# Patient Record
Sex: Female | Born: 1990 | Race: White | Hispanic: No | Marital: Single | Smoking: Never smoker
Health system: Southern US, Community
[De-identification: ages and names within clinical notes are randomized; demographics above are authoritative.]

## PROBLEM LIST (undated history)

## (undated) DIAGNOSIS — F418 Other specified anxiety disorders: Secondary | ICD-10-CM

## (undated) DIAGNOSIS — N946 Dysmenorrhea, unspecified: Secondary | ICD-10-CM

## (undated) DIAGNOSIS — M3300 Juvenile dermatopolymyositis, organ involvement unspecified: Secondary | ICD-10-CM

## (undated) DIAGNOSIS — G43909 Migraine, unspecified, not intractable, without status migrainosus: Secondary | ICD-10-CM

## (undated) DIAGNOSIS — K589 Irritable bowel syndrome without diarrhea: Secondary | ICD-10-CM

## (undated) DIAGNOSIS — F329 Major depressive disorder, single episode, unspecified: Secondary | ICD-10-CM

## (undated) DIAGNOSIS — Z8742 Personal history of other diseases of the female genital tract: Secondary | ICD-10-CM

## (undated) HISTORY — DX: Irritable bowel syndrome, unspecified: K58.9

## (undated) HISTORY — DX: Major depressive disorder, single episode, unspecified: F32.9

## (undated) HISTORY — DX: Other specified anxiety disorders: F41.8

## (undated) HISTORY — DX: Personal history of other diseases of the female genital tract: Z87.42

## (undated) HISTORY — DX: Juvenile dermatomyositis, organ involvement unspecified: M33.00

## (undated) HISTORY — DX: Migraine, unspecified, not intractable, without status migrainosus: G43.909

## (undated) HISTORY — PX: KNEE SURGERY: SHX244

## (undated) HISTORY — DX: Dysmenorrhea, unspecified: N94.6

---
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
## 2012-02-25 ENCOUNTER — Telehealth: Payer: Self-pay | Admitting: Obstetrics and Gynecology

## 2012-02-25 NOTE — Telephone Encounter (Signed)
Tc to pt per telephone call. Informed pt Depo Provera was e-pres to pharm on 12/23/11 with 11 rf's. Pt voices understanding and will call pharm.

## 2012-03-11 ENCOUNTER — Other Ambulatory Visit (INDEPENDENT_AMBULATORY_CARE_PROVIDER_SITE_OTHER): Payer: BC Managed Care – PPO

## 2012-03-11 DIAGNOSIS — Z309 Encounter for contraceptive management, unspecified: Secondary | ICD-10-CM

## 2012-03-11 DIAGNOSIS — Z3009 Encounter for other general counseling and advice on contraception: Secondary | ICD-10-CM

## 2012-03-11 DIAGNOSIS — Z3042 Encounter for surveillance of injectable contraceptive: Secondary | ICD-10-CM

## 2012-03-11 MED ORDER — MEDROXYPROGESTERONE ACETATE 150 MG/ML IM SUSP
150.0000 mg | Freq: Once | INTRAMUSCULAR | Status: AC
  Administered 2012-03-11: 150 mg via INTRAMUSCULAR

## 2012-03-11 NOTE — Progress Notes (Signed)
Next Depo due 06/02/12

## 2012-05-25 HISTORY — PX: COLONOSCOPY: SHX174

## 2012-06-02 ENCOUNTER — Ambulatory Visit: Payer: BC Managed Care – PPO | Admitting: Obstetrics and Gynecology

## 2012-06-02 ENCOUNTER — Other Ambulatory Visit: Payer: BC Managed Care – PPO

## 2012-06-02 ENCOUNTER — Encounter: Payer: Self-pay | Admitting: Obstetrics and Gynecology

## 2012-06-02 VITALS — BP 100/62 | Ht 68.75 in | Wt 176.0 lb

## 2012-06-02 DIAGNOSIS — N898 Other specified noninflammatory disorders of vagina: Secondary | ICD-10-CM

## 2012-06-02 DIAGNOSIS — N946 Dysmenorrhea, unspecified: Secondary | ICD-10-CM

## 2012-06-02 DIAGNOSIS — Z3009 Encounter for other general counseling and advice on contraception: Secondary | ICD-10-CM

## 2012-06-02 MED ORDER — MEDROXYPROGESTERONE ACETATE 150 MG/ML IM SUSP
150.0000 mg | Freq: Once | INTRAMUSCULAR | Status: AC
  Administered 2012-06-02: 150 mg via INTRAMUSCULAR

## 2012-06-02 MED ORDER — MEDROXYPROGESTERONE ACETATE 150 MG/ML IM SUSP: 150.0000 mg | INTRAMUSCULAR | Status: AC

## 2012-06-02 NOTE — Progress Notes (Signed)
Subjective:    Michele Burke is a 22 y.o. female,G0, who presents for discussion of options of birth control. Known for juvenile dermatomyositis.Entered care here for management of primary dysmenorrhea. Forgetful with 2 trials of BCP. Now on Depo-Provera since 02/2011 and satisfied but is traveling abroad for 3 months and wanted to plan ahead.   The following portions of the patient's history were reviewed and updated as appropriate: allergies, current medications, past family history.  Review of Systems Pertinent items are noted in HPI.  Objective:    BP 100/62  Ht 5' 8.75" (1.746 m)  Wt 176 lb (79.833 kg)  BMI 26.18 kg/m2    Weight:  Wt Readings from Last 1 Encounters:  06/02/12 176 lb (79.833 kg) (93.89%*)   * Growth percentiles are based on CDC 2-20 Years data.          BMI: Body mass index is 26.18 kg/(m^2).  General Appearance: Alert, appropriate appearance for age. No acute distress GYN exam: deferred   Assessment:    Primary dysmenorrhea well managed with Depo-Provera in patient traveling abroad    Plan:    After 15 minutes face-to-face, decision to continue Depo-Provera. Will pick up 2 doses today and come back for scheduled injection this pm. I will provide a letter for our plan of care for the patient to bring to care provider in [place].  Silverio Lay MD

## 2012-06-09 ENCOUNTER — Encounter: Payer: Self-pay | Admitting: Obstetrics and Gynecology

## 2012-07-12 ENCOUNTER — Telehealth: Payer: Self-pay | Admitting: Obstetrics and Gynecology

## 2012-07-12 NOTE — Telephone Encounter (Signed)
Spoke with pt mom states pt on depo provera and is studying abroad in [place] states daughter complained of bleeding wants to know if normal on depo provera advised mom if pt has not had cycle for the last two yrs on depo and now having a cycle pt needs eval to assure nothing else is going on mom voiced understanding and will inform pt

## 2012-07-13 ENCOUNTER — Telehealth: Payer: Self-pay | Admitting: Obstetrics and Gynecology

## 2012-07-13 NOTE — Telephone Encounter (Signed)
Message sent to SR and HD regarding Rx approval for Estradiol   LC CMA

## 2012-07-14 NOTE — Telephone Encounter (Signed)
Pt mother aware  Darien Ramus, CMA

## 2012-07-14 NOTE — Telephone Encounter (Signed)
It is not unusual with depo-Provera to experience BTB/ BTS. I suspect this is why the doctor prescribed a short course of Estradiol. This is customary practice.

## 2012-11-29 ENCOUNTER — Other Ambulatory Visit: Payer: Self-pay | Admitting: Gastroenterology

## 2012-11-29 DIAGNOSIS — R109 Unspecified abdominal pain: Secondary | ICD-10-CM

## 2012-11-29 DIAGNOSIS — R197 Diarrhea, unspecified: Secondary | ICD-10-CM

## 2012-12-01 ENCOUNTER — Ambulatory Visit
Admission: RE | Admit: 2012-12-01 | Discharge: 2012-12-01 | Disposition: A | Discharge status: Home / Self Care | Payer: Self-pay | Source: Ambulatory Visit | Attending: Gastroenterology | Admitting: Gastroenterology

## 2012-12-01 DIAGNOSIS — R197 Diarrhea, unspecified: Secondary | ICD-10-CM

## 2012-12-01 DIAGNOSIS — R109 Unspecified abdominal pain: Secondary | ICD-10-CM

## 2012-12-01 MED ORDER — IOHEXOL 300 MG/ML  SOLN
100.0000 mL | Freq: Once | INTRAMUSCULAR | Status: AC | PRN
  Administered 2012-12-01: 100 mL via INTRAVENOUS

## 2013-05-27 ENCOUNTER — Emergency Department (HOSPITAL_COMMUNITY)
Admission: EM | Admit: 2013-05-27 | Discharge: 2013-05-28 | Disposition: A | Discharge status: Home / Self Care | Payer: No Typology Code available for payment source | Attending: Emergency Medicine | Admitting: Emergency Medicine

## 2013-05-27 ENCOUNTER — Encounter (HOSPITAL_COMMUNITY): Payer: Self-pay | Admitting: Emergency Medicine

## 2013-05-27 ENCOUNTER — Emergency Department (HOSPITAL_COMMUNITY): Payer: No Typology Code available for payment source

## 2013-05-27 DIAGNOSIS — Z8679 Personal history of other diseases of the circulatory system: Secondary | ICD-10-CM | POA: Insufficient documentation

## 2013-05-27 DIAGNOSIS — M3390 Dermatopolymyositis, unspecified, organ involvement unspecified: Secondary | ICD-10-CM | POA: Insufficient documentation

## 2013-05-27 DIAGNOSIS — M3313 Other dermatomyositis without myopathy: Secondary | ICD-10-CM | POA: Insufficient documentation

## 2013-05-27 DIAGNOSIS — Z8742 Personal history of other diseases of the female genital tract: Secondary | ICD-10-CM | POA: Insufficient documentation

## 2013-05-27 DIAGNOSIS — IMO0002 Reserved for concepts with insufficient information to code with codable children: Secondary | ICD-10-CM | POA: Insufficient documentation

## 2013-05-27 DIAGNOSIS — R091 Pleurisy: Secondary | ICD-10-CM | POA: Insufficient documentation

## 2013-05-27 DIAGNOSIS — Z79899 Other long term (current) drug therapy: Secondary | ICD-10-CM | POA: Insufficient documentation

## 2013-05-27 LAB — CK: Total CK: 76 U/L (ref 7–177)

## 2013-05-27 LAB — URINE MICROSCOPIC-ADD ON

## 2013-05-27 LAB — CBC WITH DIFFERENTIAL/PLATELET
Basophils Absolute: 0 10*3/uL (ref 0.0–0.1)
Basophils Relative: 1 % (ref 0–1)
Eosinophils Absolute: 0.1 10*3/uL (ref 0.0–0.7)
Eosinophils Relative: 2 % (ref 0–5)
HCT: 33.8 % — ABNORMAL LOW (ref 36.0–46.0)
Hemoglobin: 11.6 g/dL — ABNORMAL LOW (ref 12.0–15.0)
Lymphocytes Relative: 50 % — ABNORMAL HIGH (ref 12–46)
Lymphs Abs: 1.8 10*3/uL (ref 0.7–4.0)
MCH: 31.9 pg (ref 26.0–34.0)
MCHC: 34.3 g/dL (ref 30.0–36.0)
MCV: 92.9 fL (ref 78.0–100.0)
Monocytes Absolute: 0.4 10*3/uL (ref 0.1–1.0)
Monocytes Relative: 10 % (ref 3–12)
Neutro Abs: 1.3 10*3/uL — ABNORMAL LOW (ref 1.7–7.7)
Neutrophils Relative %: 37 % — ABNORMAL LOW (ref 43–77)
Platelets: 193 10*3/uL (ref 150–400)
RBC: 3.64 MIL/uL — ABNORMAL LOW (ref 3.87–5.11)
RDW: 13.1 % (ref 11.5–15.5)
WBC: 3.5 10*3/uL — ABNORMAL LOW (ref 4.0–10.5)

## 2013-05-27 LAB — BASIC METABOLIC PANEL
BUN: 13 mg/dL (ref 6–23)
CO2: 22 mEq/L (ref 19–32)
Calcium: 8.8 mg/dL (ref 8.4–10.5)
Chloride: 103 mEq/L (ref 96–112)
Creatinine, Ser: 0.6 mg/dL (ref 0.50–1.10)
GFR calc Af Amer: >90 mL/min (ref >90)
GFR calc non Af Amer: >90 mL/min (ref >90)
Glucose, Bld: 89 mg/dL (ref 70–99)
Potassium: 3.8 mEq/L (ref 3.7–5.3)
Sodium: 136 mEq/L — ABNORMAL LOW (ref 137–147)

## 2013-05-27 LAB — URINALYSIS, ROUTINE W REFLEX MICROSCOPIC
Bilirubin Urine: NEGATIVE
Glucose, UA: NEGATIVE mg/dL
Hgb urine dipstick: NEGATIVE
Ketones, ur: NEGATIVE mg/dL
Nitrite: NEGATIVE
Protein, ur: NEGATIVE mg/dL
Specific Gravity, Urine: 1.008 (ref 1.005–1.030)
Urobilinogen, UA: 0.2 mg/dL (ref 0.0–1.0)
pH: 7 (ref 5.0–8.0)

## 2013-05-27 LAB — D-DIMER, QUANTITATIVE: D-Dimer, Quant: <0.27 ug/mL-FEU (ref 0.00–0.48)

## 2013-05-27 LAB — TROPONIN I

## 2013-05-27 LAB — PREGNANCY, URINE: Preg Test, Ur: NEGATIVE

## 2013-05-27 MED ORDER — ONDANSETRON HCL 4 MG/2ML IJ SOLN
4.0000 mg | Freq: Once | INTRAMUSCULAR | Status: AC
  Administered 2013-05-27: 4 mg via INTRAVENOUS
  Filled 2013-05-27: qty 2

## 2013-05-27 MED ORDER — KETOROLAC TROMETHAMINE 30 MG/ML IJ SOLN
30.0000 mg | Freq: Once | INTRAMUSCULAR | Status: AC
  Administered 2013-05-27: 30 mg via INTRAVENOUS
  Filled 2013-05-27: qty 1

## 2013-05-27 MED ORDER — HYDROCODONE-ACETAMINOPHEN 5-325 MG PO TABS: 2.0000 | ORAL_TABLET | ORAL | Status: DC | PRN

## 2013-05-27 MED ORDER — NAPROXEN 500 MG PO TABS: 500.0000 mg | ORAL_TABLET | Freq: Two times a day (BID) | ORAL | Status: DC

## 2013-05-27 MED ORDER — MORPHINE SULFATE 4 MG/ML IJ SOLN
6.0000 mg | Freq: Once | INTRAMUSCULAR | Status: AC
  Administered 2013-05-27: 6 mg via INTRAVENOUS
  Filled 2013-05-27: qty 2

## 2013-05-27 NOTE — ED Notes (Addendum)
Patient is from home accompanied by her mom. Patient states she has been having chest pressure for one week but it has gotten worst today have immunoglobulin infusion today. Patient states the pain got worst when she climbed the steps this evening.  Patient denies sob or NV/ with chest pain. 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

## 2013-05-27 NOTE — Discharge Instructions (Signed)
Pleurisy °Pleurisy is redness, puffiness (swelling), and soreness (inflammation) of the lining of the lungs. It can be hard to breathe and hurt to breathe. Coughing or deep breathing will make it hurt more. It is often caused by an existing infection or disease.  °HOME CARE °· Only take medicine as told by your doctor. °· Only take antibiotic medicine as directed. Make sure to finish it even if you start to feel better. °GET HELP RIGHT AWAY IF:  °· Your lips, fingernails, or toenails are blue or dark. °· You cough up blood. °· You have a hard time breathing. °· Your pain is not controlled with medicine or it lasts for more than 1 week. °· Your pain spreads (radiates) into your neck, arms, or jaw. °· You are short of breath or wheezing. °· You develop a fever, rash, throw up (vomit), or faint. °MAKE SURE YOU:  °· Understand these instructions. °· Will watch your condition. °· Will get help right away if you are not doing well or get worse. °Document Released: 04/23/2008 Document Revised: 01/11/2013 Document Reviewed: 10/23/2012 °Exit[employer]® Patient Information ©2014 [employer]. ° °

## 2013-05-27 NOTE — ED Provider Notes (Signed)
Patient seen with Ebbie Ridgehris Lawyer PA-C. Meds reviewed. Pt history reviewed and pt ec=xamined. Slight risk of DVT/Pe with IGG.  D-Dimer negative.  No EKG changes, and normal Troponin.  Normal Cxr.  No fever, clear lungs.  Have ruled our ACS, PE, Pneumonia, PTX.  Plan is tx for pleurisy with NSAID and pain meds.  Info given.  Discussed with pt and parents.  Rolland PorterMark Turner Kunzman, MD 05/27/13 2317

## 2013-05-27 NOTE — ED Notes (Signed)
Assumed care of patient Patient states that she began experiencing CP and mild SOB after her transfusion today Patient states that she has had similar episodes before, but "it's never happened this soon afterwards." Patient describes pain in the center of her chest as "pressure, like someone is sitting on me" Patient states that pain is "better now that I'm laying down." Will inform MD/PA of patient c/o pain LCTA

## 2013-05-27 NOTE — ED Notes (Signed)
Patient ambulated to CXR without difficulty

## 2013-05-27 NOTE — ED Provider Notes (Signed)
CSN: [id number]     Arrival date & time 05/27/13  1849 History   First MD Initiated Contact with Patient 05/27/13 1923     Chief Complaint  Patient presents with  . Chest Pain   (Consider location/radiation/quality/duration/timing/severity/associated sxs/prior Treatment) HPI Patient presents emergency department with chest pressure for the past week, but has gotten worse over the last 12 hours.  Patient, states she got an immunoglobulin infusion, today, and the pain got worse patient denies nausea, vomiting, shortness breath, weakness, dizziness, headache, blurred vision, back pain, dysuria, fever, cough, sore throat, abdominal pain, or syncope.  The patient, states she did not take any medications prior to arrival.  She denies any thing that makes her pain, better or worse. Past Medical History  Diagnosis Date  . Migraines 07/11/2010  . History of menorrhagia 07/11/2010  . Dysmenorrhea 07/11/2010  . Juvenile dermatomyositis 07/11/2010   Past Surgical History  Procedure Laterality Date  . Wisdom tooth extraction  2011  . Tonsillectomy  2000  . Knee surgery     Family History  Problem Relation Age of Onset  . Hypertension Father    History  Substance Use Topics  . Smoking status: Never Smoker   . Smokeless tobacco: Never Used  . Alcohol Use: No   OB History   Grav Para Term Preterm Abortions TAB SAB Ect Mult Living   0              Review of Systems All other systems negative except as documented in the HPI. All pertinent positives and negatives as reviewed in the HPI. Allergies  Other  Home Medications   Current Outpatient Rx  Name  Route  Sig  Dispense  Refill  . calcium carbonate (OS-CAL) 600 MG TABS   Oral   Take 600 mg by mouth 2 (two) times daily with a meal.         . dexmethylphenidate (FOCALIN XR) 15 MG 24 hr capsule   Oral   Take 15 mg by mouth daily as needed (to help with focus issues).          . hydroxychloroquine (PLAQUENIL) 200 MG tablet   Oral  Take 400 mg by mouth daily.          . Immune Globulin  10% (PRIVIGEN) 20 GM/200ML SOLN   Intravenous   Inject 60 g into the vein every 21 ( twenty-one) days. 60 grams over 8 hours         . medroxyPROGESTERone (DEPO-PROVERA) 150 MG/ML injection   Intramuscular   Inject 1 mL (150 mg total) into the muscle every 3 (three) months.   2 mL   11     Please serve 2 doses Patient is traveling abroad t ...   . methotrexate (RHEUMATREX) 2.5 MG tablet   Oral   Take 20 mg by mouth once a week. monday         . mycophenolate (CELLCEPT) 500 MG tablet   Oral   Take 500 mg by mouth 2 (two) times daily.         . ondansetron (ZOFRAN) 4 MG tablet   Oral   Take 4-8 mg by mouth every 8 (eight) hours as needed for nausea or vomiting.         . predniSONE (DELTASONE) 10 MG tablet   Oral   Take 10 mg by mouth daily with breakfast.         . sertraline (ZOLOFT) 50 MG tablet   Oral  Take 50 mg by mouth daily.          BP 135/79  Pulse 82  Temp(Src) 98.5 F (36.9 C) (Oral)  SpO2 98% Physical Exam  Nursing note and vitals reviewed. Constitutional: She is oriented to person, place, and time. She appears well-developed and well-nourished. No distress.  HENT:  Head: Normocephalic and atraumatic.  Mouth/Throat: Oropharynx is clear and moist.  Eyes: Pupils are equal, round, and reactive to light.  Neck: Normal range of motion. Neck supple.  Cardiovascular: Normal rate, regular rhythm and normal heart sounds.  Exam reveals no gallop and no friction rub.   No murmur heard. Pulmonary/Chest: Effort normal and breath sounds normal. No respiratory distress. She has no wheezes.  Neurological: She is alert and oriented to person, place, and time.  Skin: Skin is warm and dry. No rash noted. No erythema.    ED Course  Procedures (including critical care time) Labs Review Labs Reviewed  CBC WITH DIFFERENTIAL - Abnormal; Notable for the following:    WBC 3.5 (*)    RBC 3.64 (*)     Hemoglobin 11.6 (*)    HCT 33.8 (*)    Neutrophils Relative % 37 (*)    Neutro Abs 1.3 (*)    Lymphocytes Relative 50 (*)    All other components within normal limits  BASIC METABOLIC PANEL - Abnormal; Notable for the following:    Sodium 136 (*)    All other components within normal limits  PREGNANCY, URINE  URINALYSIS, ROUTINE W REFLEX MICROSCOPIC   Imaging Review Dg Chest 2 View  05/27/2013   CLINICAL DATA:  Chest pressure for 1 week, worse today. Autoimmune disorder with immunoglobulin infusion today.  EXAM: CHEST  2 VIEW  COMPARISON:  None.  FINDINGS: The heart size and mediastinal contours are normal. The lungs are clear. There is no pleural effusion or pneumothorax. No acute osseous findings are identified.  IMPRESSION: No active cardiopulmonary process.   Electronically Signed   By: Roxy HorsemanBill  Veazey M.D.   On: 05/27/2013 20:15    EKG Interpretation    Date/Time:  Saturday May 27 2013 19:03:00 EST Ventricular Rate:  72 PR Interval:  125 QRS Duration: 83 QT Interval:  372 QTC Calculation: 407 R Axis:   44 Text Interpretation:  Sinus arrhythmia Confirmed by WARD  DO, KRISTEN ([id number]) on 05/27/2013 7:28:45 PM            Patient will be treated for pleuritic type chest pain, must follow up with her primary care Dr. told to return here as needed.  Patient was given the results.  All questions were answered  Carlyle DollyChristopher W Mel Tadros, PA-C 05/27/13 2330

## 2014-09-23 HISTORY — PX: ULNA OSTEOTOMY: SHX1077

## 2015-02-28 ENCOUNTER — Ambulatory Visit: Payer: Self-pay | Admitting: Family Medicine

## 2015-02-28 ENCOUNTER — Encounter: Payer: Self-pay | Admitting: Family Medicine

## 2015-02-28 ENCOUNTER — Ambulatory Visit (INDEPENDENT_AMBULATORY_CARE_PROVIDER_SITE_OTHER): Payer: BLUE CROSS/BLUE SHIELD | Admitting: Family Medicine

## 2015-02-28 VITALS — BP 102/62 | HR 72 | Ht 69.0 in | Wt 159.4 lb

## 2015-02-28 DIAGNOSIS — Z23 Encounter for immunization: Secondary | ICD-10-CM | POA: Diagnosis not present

## 2015-02-28 DIAGNOSIS — R1013 Epigastric pain: Secondary | ICD-10-CM

## 2015-02-28 DIAGNOSIS — M3300 Juvenile dermatopolymyositis, organ involvement unspecified: Secondary | ICD-10-CM | POA: Diagnosis not present

## 2015-02-28 DIAGNOSIS — F325 Major depressive disorder, single episode, in full remission: Secondary | ICD-10-CM

## 2015-02-28 MED ORDER — DEXLANSOPRAZOLE 60 MG PO CPDR: 60.0000 mg | DELAYED_RELEASE_CAPSULE | Freq: Every day | ORAL | Status: DC

## 2015-02-28 NOTE — Patient Instructions (Signed)
We are giving you Prevnar (pneumonia vaccine), flu shot and Gardisil (HPV vaccine).   You likely have gastritis vs a nonbleeding ulcer. Using a PPI such as Nexium, Prilosec, etc should help.  Likely the GI doctor will give you a prescription tomorrow. Avoid all anti-inflammatory medication which will cause more pain/irritation. You may also have a component of reflux (belching).  Food Choices for Gastroesophageal Reflux Disease, Adult When you have gastroesophageal reflux disease (GERD), the foods you eat and your eating habits are very important. Choosing the right foods can help ease the discomfort of GERD. WHAT GENERAL GUIDELINES DO I NEED TO FOLLOW?  Choose fruits, vegetables, whole grains, low-fat dairy products, and low-fat meat, fish, and poultry.  Limit fats such as oils, salad dressings, butter, nuts, and avocado.  Keep a food diary to identify foods that cause symptoms.  Avoid foods that cause reflux. These may be different for different people.  Eat frequent small meals instead of three large meals each day.  Eat your meals slowly, in a relaxed setting.  Limit fried foods.  Cook foods using methods other than frying.  Avoid drinking alcohol.  Avoid drinking large amounts of liquids with your meals.  Avoid bending over or lying down until 2-3 hours after eating. WHAT FOODS ARE NOT RECOMMENDED? The following are some foods and drinks that may worsen your symptoms: Vegetables Tomatoes. Tomato juice. Tomato and spaghetti sauce. Chili peppers. Onion and garlic. Horseradish. Fruits Oranges, grapefruit, and lemon (fruit and juice). Meats High-fat meats, fish, and poultry. This includes hot dogs, ribs, ham, sausage, salami, and bacon. Dairy Whole milk and chocolate milk. Sour cream. Cream. Butter. Ice cream. Cream cheese.  Beverages Coffee and tea, with or without caffeine. Carbonated beverages or energy drinks. Condiments Hot sauce. Barbecue sauce.   Sweets/Desserts Chocolate and cocoa. Donuts. Peppermint and spearmint. Fats and Oils High-fat foods, including [place] fries and potato chips. Other Vinegar. Strong spices, such as black pepper, white pepper, red pepper, cayenne, curry powder, cloves, ginger, and chili powder. The items listed above may not be a complete list of foods and beverages to avoid. Contact your dietitian for more information.   This information is not intended to replace advice given to you by your health care provider. Make sure you discuss any questions you have with your health care provider.   Document Released: 05/11/2005 Document Revised: 06/01/2014 Document Reviewed: 03/15/2013 Elsevier Interactive Patient Education [employer]

## 2015-02-28 NOTE — Progress Notes (Signed)
Chief Complaint  Patient presents with  . Establish Care    new patient to establish. Seeing specialist tomorrow w/[facility] for nausea and stomach pains-PA for Dr.Schooler.   . Flu Vaccine    would like to have flu shot but wanted to know if she should have HD Flu because of her auto-immune disease? Also wants pneumonia vaccine.    Patient presents to establish care.  She currently has been having stomach pain, and has appointment with GI tomorrow.   She reports that she went to [facility] 9/15-17.  Friend had a stomach bug, and then she started having nausea, felt bad.  Felt worse last week and into this week.  Ongoing nausea, and abdominal pain (upper), gas and bloating.  Passing foul-smelling gas, frequent hiccups and belching more than usual. Feels like she needs to eat more, pain with empty stomach.  Hasn't actually vomited. Bowels are normal.  They were loose about 10 days ago, but that resolved. Denies hematochezia, melena, coffee-ground emesis or any emesis for that matter.  She started taking Zantac 3 days ago, taking it twice daily ( )--it has helped a little.  Zofran wasn't helpful with the nausea.  She has been taking advil regularly since 05/2014 for left wrist pain. She had surgery in May, still having pain.  She was taking 600-800mg  up to two or three times daily.  She stopped the advil earlier this week, and has been taking tylenol instead.  Immunizations:  Reports having had 2 of the 3 Gardisil, last of which was over a year ago.  She was told she needed to start the series over again. She has never been sexually active. She is also asking about pneumonia vaccine, due to her immunosuppressive medications. No immunization records are available.  She reports that her last pneumovax was about 9-10 years ago. She has never had prevnar.  Other doctors caring for patient: Dr. Denyce Robert at [facility] Rheumatology Psych:  Dr. Phillip Heal ([facility]) GYN: Dr. Estanislado Pandy  (getting regular Depo Provera shots) GI: [facility] (Dr. Bosie Clos) Has appt tomorrow morning with his PA Dentist: Dr. Yancey Flemings Ophtho:  [facility], Dr. Randon Goldsmith  Past Medical History  Diagnosis Date  . Migraines middle school  . History of menorrhagia 07/11/2010  . Dysmenorrhea 07/11/2010  . 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 Irritable bowel syndrome   . Depression 2008    recurred in HS  . 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 Wisdom tooth extraction  2011  . Tonsillectomy  2000  . Knee surgery Right     plica removal, arthroscopic  . Ulna osteotomy Left 09/2014    Dr. Orlan Leavens; ulna shortening  . Colonoscopy  2014    Dr. Jerral Ralph    Social History   Social History  . Marital Status: Single    Spouse Name: N/A  . Number of Children: N/A  . Years of Education: N/A   Occupational History  . student    Social History Main Topics  . Smoking status: Never Smoker   . Smokeless tobacco: Never Used  . Alcohol Use: No     Comment: infrequent  . Drug Use: No  . Sexual Activity: No     Comment: CELIBATE   Other Topics Concern  . Not on file   Social History Narrative   She is a Holiday representative at [employer] (took a year off;  studied in [place] x 3 mos, lived with family in [place] x 3 mos).  Started with GI problems upon return from [place].    Family History  Problem Relation Age of Onset  . Hypertension Father   . Hypothyroidism Mother   . Hyperlipidemia Mother     borderline; good HDL, borderline LDL  . Allergies Mother   . Lupus Maternal Grandmother   . Heart disease Maternal Grandfather     CHF  . COPD Maternal Grandfather   . Asthma Maternal Grandfather   . Cancer Maternal Grandfather     thymic  . Stroke Paternal Grandfather 37  . Diabetes Paternal Grandfather     Outpatient Encounter Prescriptions as of 02/28/2015  Medication Sig Note  . calcium carbonate (OS-CAL) 600 MG TABS  Take 600 mg by mouth 2 (two) times daily with a meal. 02/28/2015: Takes once daily  . cetirizine (ZYRTEC) 10 MG tablet Take 10 mg by mouth daily.   . cholecalciferol (VITAMIN D) 1000 UNITS tablet Take 1,000 Units by mouth daily.   . hydroxychloroquine (PLAQUENIL) 200 MG tablet Take 400 mg by mouth daily.    Marland Kitchen immune globulin, human, (GAMMAGARD S/D) 5 G injection Inject into the vein. 02/28/2015: Two days in a row, once a month, IV infusion  . leucovorin (WELLCOVORIN) 5 MG tablet Take 10 mg by mouth 2 (two) times a week. 02/28/2015: Takes the day after methotrexate   . medroxyPROGESTERone (DEPO-PROVERA) 150 MG/ML injection Inject 1 mL (150 mg total) into the muscle every 3 (three) months. 02/28/2015: Every 3 mos (at Dr. Cloretta Ned office), next due in 2 weeks  . methotrexate (RHEUMATREX) 2.5 MG tablet Take 10 mg by mouth once a week. monday   . Multiple Vitamins-Minerals (HAIR/SKIN/NAILS PO) Take 1 tablet by mouth daily.   . sertraline (ZOLOFT) 100 MG tablet Take 150 mg by mouth daily.   Marland Kitchen dexmethylphenidate (FOCALIN XR) 15 MG 24 hr capsule Take 15 mg by mouth daily as needed (to help with focus issues).  02/28/2015: Uses prn when taking exams (to help with distractibility)  . [DISCONTINUED] HYDROcodone-acetaminophen (NORCO/VICODIN) 5-325 MG per tablet Take 2 tablets by mouth every 4 (four) hours as needed.   . [DISCONTINUED] Immune Globulin  10% (PRIVIGEN) 20 GM/200ML SOLN Inject 60 g into the vein every 21 ( twenty-one) days. 60 grams over 8 hours   . [DISCONTINUED] mycophenolate (CELLCEPT) 500 MG tablet Take 500 mg by mouth 2 (two) times daily.   . [DISCONTINUED] naproxen (NAPROSYN) 500 MG tablet Take 1 tablet (500 mg total) by mouth 2 (two) times daily.   . [DISCONTINUED] ondansetron (ZOFRAN) 4 MG tablet Take 4-8 mg by mouth every 8 (eight) hours as needed for nausea or vomiting.   . [DISCONTINUED] predniSONE (DELTASONE) 10 MG tablet Take 10 mg by mouth daily with breakfast.   . [DISCONTINUED]  sertraline (ZOLOFT) 50 MG tablet Take 50 mg by mouth daily.    No facility-administered encounter medications on file as of 02/28/2015.    Allergies  Allergen Reactions  . Other     seasonal   ROS: no fever, chills, URI symptoms, cough, shortness of breath, headaches, dizziness, chest pain. +GI complaints as per HPI. No urinary complaints. No bleeding, bruising, rash. Depression is controlled, no anxiety.  PHYSICAL EXAM: BP 102/62 mmHg  Pulse 72  Ht 5\' 9"  (1.753 m)  Wt 159 lb 6.4 oz (72.303 kg)  BMI 23.53 kg/m2  Well developed, pleasant female, accompanied by her mother, in no distress HEENT: PERRL,  EOMI, conjunctiva clear. OP is clear Neck: no lymphadenopathy thyromegaly or bruit Heart: regular rate and rhythm without murmur Lungs: clear bilaterally, no wheezes, rales, ronchi Back: no CVA or spinal tenderness Abdomen: +epigastric tenderness. No rebound tenderness, guarding, or mass.  Normal bowel sounds.  No hepatosplenomegaly, negative Murphy sign Extremities: WHSS left forearm/wrist. No edema, normal pulses Skin: no rashes Neuro: alert and oriented, cranial nerves intact Psych: normal mood, affect, hygiene and grooming  ASSESSMENT/PLAN:  Need for prophylactic vaccination and inoculation against influenza - Plan: Flu Vaccine QUAD 36+ mos PF IM (Fluarix & Fluzone Quad PF)  Abdominal pain, epigastric - suspect gastritis, may have component of GERD. Stop NSAIDS. PPI. fu with GI tomorrow as scheduled. - Plan: dexlansoprazole (DEXILANT) 60 MG capsule  Immunization due - Plan: HPV 9-valent vaccine,Recombinat (Gardasil 9), Pneumococcal conjugate vaccine 13-valent  Juvenile dermatomyositis (HCC) - under care of rheum, stable on current regimen. Prevnar rec given immunosuppressive meds  Depression, major, in remission (HCC) - continue care with psych, doing well   Prevnar-13, HPV #3 and flu shot today Risks/side effects reviewed. Discussed that efficacy of HPV could be less due  to not following recommended timing (late on third vaccine), but I don't usually recommend restarting the series (as she was told by her GYN).  She was told to start series over. She was advised that the vaccines are the same, so that if she discusses further with her other doctors, and it is felt she should repeat the whole series, that her next immunization would be due in 2 mos.  Stop NSAIDs. Given #5 Dexilant samples to take instead of Zantac. F/u with GI tomorrow. DDx reviewed in detail (virus, gastritis, ulcer, GERD, H pylori)  F/u prn

## 2015-03-07 ENCOUNTER — Telehealth: Payer: Self-pay | Admitting: *Deleted

## 2015-03-07 NOTE — Telephone Encounter (Signed)
Notes were reviewed.  She was seen on 10/7.  No mention of her having tried the Dexilant samples given by us in their notes--when you call patient, see if she took them.  She was prescribed pantoprazole 40mg .  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  Give it another week before getting second opinion, as I truly think she will feel better with this. Her labs were reassuring. If not improving (or if worse) next week, then second opinion would be indicated.  Technically, this GI visit was a second opinion of what I had said at her visit.  I'm happy to get another opinion, but give the meds just a little more time to work.  (and as stated, next step should also include H pylori eval, if not getting better).  (see my other note as well to advise pt)

## 2015-03-07 NOTE — Telephone Encounter (Signed)
Records in red folder on your shelf. Do you want to review before I call her?

## 2015-03-07 NOTE — Telephone Encounter (Signed)
Please try and get notes.  I also suspected gastritis (vs component of reflux) based on my assessment. We gave her Dexilant samples and told her to stop all NSAIDs.  What was their recommendation/treatment?  This shouldn't be a long-term problem, just need to get on the right medications to treat (give it a few weeks), and if not improving ,may need endoscopy.  She should likely give the treatment that was recommended enough time to work. If worsening, the other possibility is H.pylori (which would be treated differently).  Okay to refer to [facility] if worsening, but they might want her to be on the PPI's for 2 weeks before doing more invasive testing/procedures.

## 2015-03-07 NOTE — Telephone Encounter (Signed)
Went over all of the messages with patient. She did verbalize understanding and she will call back end of next week if not better or worse and we will put in referral to [facility] per Dr.Knapp.

## 2015-03-07 NOTE — Telephone Encounter (Signed)
Error

## 2015-03-07 NOTE — Telephone Encounter (Signed)
Patient was diagnosed with gast[facility] this past Tuesday at [facility], requesting to see someone at [facility] for a second opinion-just wants to start over and see another practice. Feels like her stomach symptoms are worsening.

## 2015-05-14 ENCOUNTER — Ambulatory Visit (INDEPENDENT_AMBULATORY_CARE_PROVIDER_SITE_OTHER): Payer: BLUE CROSS/BLUE SHIELD | Admitting: Family Medicine

## 2015-05-14 ENCOUNTER — Encounter: Payer: Self-pay | Admitting: Family Medicine

## 2015-05-14 VITALS — BP 122/72 | HR 60 | Temp 98.1°F | Wt 160.4 lb

## 2015-05-14 DIAGNOSIS — R112 Nausea with vomiting, unspecified: Secondary | ICD-10-CM

## 2015-05-14 DIAGNOSIS — J069 Acute upper respiratory infection, unspecified: Secondary | ICD-10-CM | POA: Diagnosis not present

## 2015-05-14 DIAGNOSIS — R059 Cough, unspecified: Secondary | ICD-10-CM

## 2015-05-14 DIAGNOSIS — R05 Cough: Secondary | ICD-10-CM | POA: Diagnosis not present

## 2015-05-14 MED ORDER — ONDANSETRON HCL 4 MG PO TABS: 4.0000 mg | ORAL_TABLET | Freq: Three times a day (TID) | ORAL | Status: DC | PRN

## 2015-05-14 MED ORDER — HYDROCOD POLST-CPM POLST ER 10-8 MG/5ML PO SUER: 5.0000 mL | Freq: Every evening | ORAL | Status: DC | PRN

## 2015-05-14 NOTE — Progress Notes (Signed)
   Subjective:    Patient ID: Michele Burke, female    DOB: [birth year], 24 y.o.   MRN: [id number]  HPI Chief Complaint  Patient presents with  . cough    coughing. really bad cough, nausea. cold. headache   She is here with complaints of a 5 day history of cough that started out as a dry cough and progressed to a productive cough. Also reports purulent nasal drainage. States she has been tired and reports chills and nausea.  Denies fever, body aches, ear pain, sore throat.  Does not smoke, positive sick contacts. She did get her flu shot this year.  History of autoimmune disorder. Recent use of antibiotic, sinus infection in early November, treated with Augmentin at an Urgent Care. She states her symptoms fully resolved.   States she has been taking Delsym and states it helps but she ran out. She took emetrol for nausea today. States likely related to cough and drainage.    reviewed allergies, medications, past medical and social history.   Review of Systems Pertinent positives and negatives in the history of present illness.    Objective:   Physical Exam BP 122/72 mmHg  Pulse 60  Temp(Src) 98.1 F (36.7 C) (Oral)  Wt 160 lb 6.4 oz (72.757 kg)  Alert and in no distress. No sinus tenderness. Tympanic membranes and canals are normal. Pharyngeal area is erythematous. Neck is supple without adenopathy. Cardiac exam shows a regular sinus rhythm without murmurs or gallops. Lungs are clear to auscultation.      Assessment & Plan:  Cough  Acute upper respiratory infection  Discussed patient with Dr. Susann GivensLalonde. Discussed with patient that we will do watchful waiting since her symptoms are most likely related to a viral etiology. She will let me know If her symptoms worsen or continue over the next 2-3 days. Recommend symptomatic treatment with Delsym and Tussionex if needed. Prescription given to patient for Tussionex with instructions to not drive, operate machinery or drink alcohol  with this medication. Zofran also prescribed for nausea as needed.

## 2015-05-14 NOTE — Patient Instructions (Signed)
Stay well hydrated, continue taking Delsym for cough and as needed you can use the Tussionex that I prescribed. Let us know by Thursday afternoon if you're not feeling better or sooner if you get worse  Cough, Adult Coughing is a reflex that clears your throat and your airways. Coughing helps to heal and protect your lungs. It is normal to cough occasionally, but a cough that happens with other symptoms or lasts a long time may be a sign of a condition that needs treatment. A cough may last only 2-3 weeks (acute), or it may last longer than 8 weeks (chronic). CAUSES Coughing is commonly caused by:  Breathing in substances that irritate your lungs.  A viral or bacterial respiratory infection.  Allergies.  Asthma.  Postnasal drip.  Smoking.  Acid backing up from the stomach into the esophagus (gastroesophageal reflux).  Certain medicines.  Chronic lung problems, including COPD (or rarely, lung cancer).  Other medical conditions such as heart failure. HOME CARE INSTRUCTIONS  Pay attention to any changes in your symptoms. Take these actions to help with your discomfort:  Take medicines only as told by your health care provider.  If you were prescribed an antibiotic medicine, take it as told by your health care provider. Do not stop taking the antibiotic even if you start to feel better.  Talk with your health care provider before you take a cough suppressant medicine.  Drink enough fluid to keep your urine clear or pale yellow.  If the air is dry, use a cold steam vaporizer or humidifier in your bedroom or your home to help loosen secretions.  Avoid anything that causes you to cough at work or at home.  If your cough is worse at night, try sleeping in a semi-upright position.  Avoid cigarette smoke. If you smoke, quit smoking. If you need help quitting, ask your health care provider.  Avoid caffeine.  Avoid alcohol.  Rest as needed. SEEK MEDICAL CARE IF:   You have new  symptoms.  You cough up pus.  Your cough does not get better after 2-3 weeks, or your cough gets worse.  You cannot control your cough with suppressant medicines and you are losing sleep.  You develop pain that is getting worse or pain that is not controlled with pain medicines.  You have a fever.  You have unexplained weight loss.  You have night sweats. SEEK IMMEDIATE MEDICAL CARE IF:  You cough up blood.  You have difficulty breathing.  Your heartbeat is very fast.   This information is not intended to replace advice given to you by your health care provider. Make sure you discuss any questions you have with your health care provider.   Document Released: 11/07/2010 Document Revised: 01/30/2015 Document Reviewed: 07/18/2014 Elsevier Interactive Patient Education [employer]2016 Elsevier Inc.

## 2015-05-28 ENCOUNTER — Telehealth: Payer: Self-pay | Admitting: Family Medicine

## 2015-05-28 MED ORDER — AZITHROMYCIN 250 MG PO TABS: ORAL_TABLET | ORAL | Status: DC

## 2015-05-28 NOTE — Telephone Encounter (Signed)
Pt called and stated that she is still coughing. She states she had been coughing since her appt but failed to call due to the holidays. Pt can be reached at [phone] and [facility] [address].

## 2015-05-28 NOTE — Telephone Encounter (Signed)
Spoke to patient. No fever, and just not getting better. Sent in med to pharmacy

## 2015-05-28 NOTE — Telephone Encounter (Signed)
Please call and ask her if her cough is productive, fever? Is she worse or just not improving.  I will call in an antibiotic for her. If she is not back to normal after completing the antibiotic she should let me know.

## 2015-08-30 ENCOUNTER — Encounter: Payer: Self-pay | Admitting: Family Medicine

## 2015-08-30 ENCOUNTER — Ambulatory Visit (INDEPENDENT_AMBULATORY_CARE_PROVIDER_SITE_OTHER): Payer: BLUE CROSS/BLUE SHIELD | Admitting: Family Medicine

## 2015-08-30 VITALS — BP 118/78 | HR 68 | Temp 99.3°F | Wt 163.0 lb

## 2015-08-30 DIAGNOSIS — J302 Other seasonal allergic rhinitis: Secondary | ICD-10-CM | POA: Diagnosis not present

## 2015-08-30 DIAGNOSIS — J029 Acute pharyngitis, unspecified: Secondary | ICD-10-CM | POA: Diagnosis not present

## 2015-08-30 DIAGNOSIS — R5383 Other fatigue: Secondary | ICD-10-CM

## 2015-08-30 LAB — POCT RAPID STREP A (OFFICE): Rapid Strep A Screen: NEGATIVE

## 2015-08-30 LAB — POCT MONO (EPSTEIN BARR VIRUS): MONO, POC: NEGATIVE

## 2015-08-30 NOTE — Progress Notes (Signed)
  Subjective:  Michele Burke is a 25 y.o. female who presents for evaluation and treatment of allergic symptoms. Symptoms include: a 2 week history of clear rhinorrhea, headaches, itchy eyes and nasal congestion and fatigue and are present in a seasonal pattern. Reports sore throat that appears to getting worse along with worsening fatigue. Treatment currently includes Allegra, Flonase, and [employer] eye drops and is somewhat effective. She switched from Zyrtec to Allegra due to Zyrtec not being as effective. Denies fever, chills, nausea, vomiting, neck pain, back pain or abdominal pain.  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 Has had positive sick contacts.   The following portions of the patient's history were reviewed and updated as appropriate: allergies, current medications, past family history, past medical history, past social history, past surgical history and problem list.  ROS as in subjective   Objective: Filed Vitals:   08/30/15 1450  BP: 118/78  Pulse: 68  Temp: 99.3 F (37.4 C)    General appearance: Alert, WD/WN, no distress                             Skin: warm, no rash                           Head: mild maxillary sinus tenderness L>R                            Eyes: conjunctiva normal, corneas clear, PERRLA                            Ears: pearly TMs, external ear canals normal with moderate cerumen                          Nose: septum midline, turbinates swollen L>R, erythema deep red to left nare, with clear discharge                    Mouth/throat: MMM, tongue normal, pharyngeal erythema without edema or exudate                           Neck: supple, no adenopathy, no thyromegaly, non tender                          Heart: RRR, normal S1, S2, no murmurs                         Lungs: CTA bilaterally, no wheezes, rales, or rhonchi   Assessment:  Other seasonal allergic rhinitis  Acute pharyngitis, unspecified etiology -  Plan: POCT Mono (Epstein Barr Virus), POCT rapid strep A  Other fatigue - Plan: POCT Mono (Epstein Barr Virus)    Plan:   Rapid strep: negative Mono spot: negative   Discussed that her symptoms are most likely related to a viral etiology and exacerbated by underlying allergies. Recommend continued treatment of allergies including Allegra, Flonase and saline nasal spray.  Recommend staying well hydrated, taking Tylenol for fever, pain and calling Monday if she is not improving.

## 2015-09-02 ENCOUNTER — Other Ambulatory Visit: Payer: Self-pay | Admitting: Family Medicine

## 2015-09-02 ENCOUNTER — Telehealth: Payer: Self-pay | Admitting: Family Medicine

## 2015-09-02 DIAGNOSIS — J019 Acute sinusitis, unspecified: Secondary | ICD-10-CM

## 2015-09-02 MED ORDER — AMOXICILLIN 875 MG PO TABS: 875.0000 mg | ORAL_TABLET | Freq: Two times a day (BID) | ORAL | Status: DC

## 2015-09-02 NOTE — Progress Notes (Signed)
Called and let patient know med was sent

## 2015-09-02 NOTE — Telephone Encounter (Signed)
Pt called and states she is not any better.  [facility] [address] [place] or call pt at [phone]

## 2015-09-02 NOTE — Telephone Encounter (Signed)
Please find out which symptoms are bothering her most. Sore throat? Fever? Cough? Thanks.

## 2015-09-02 NOTE — Telephone Encounter (Signed)
Pt is having fatigue, running nose with green mucous, drainage, sinus headache and all her other allergies symptoms she talked to you about on Friday. Pt was advised to check with the pharmacy later on today to see if rx will be there

## 2015-10-01 ENCOUNTER — Telehealth: Payer: Self-pay | Admitting: Family Medicine

## 2015-10-01 NOTE — Telephone Encounter (Signed)
Advise pt that this medication is over-the-counter.  Meclizine 25mg --if too sedating can take just 1/2 tablet.  If she isn't driving, then full tablet should be fine.  There are various brands of this (bonine, less drowsy formulation of Dramamine)--she can check with pharmacist.  But 25mg  meclizine doesn't require rx.

## 2015-10-01 NOTE — Telephone Encounter (Signed)
Pt requesting med for motion sickness. She will be going on a trip by car on Monday morning and she does get car sick while traveling. [facility] [address]

## 2015-10-02 MED ORDER — SCOPOLAMINE 1 MG/3DAYS TD PT72: 1.0000 | MEDICATED_PATCH | TRANSDERMAL | Status: AC

## 2015-10-02 NOTE — Telephone Encounter (Signed)
rx sent, could not get a hold of patient-will try again later.

## 2015-10-02 NOTE — Telephone Encounter (Signed)
Patient advised.

## 2015-10-02 NOTE — Telephone Encounter (Signed)
Patches last for 3d, a little overkill for a car ride, but that should be fine--place the patch 30-60 minutes prior to travel. Please send in transderm scopolamine, okay for #4 so that she has extra for the future.  Advise her that we have had some denials for this, so if not covered, she may need to pay for it (any authorization takes longer than when she needs it by). Dx motion sickness

## 2015-10-02 NOTE — Telephone Encounter (Signed)
Called patient back and she states that dramamine makes her drowsy, she has tried the less drowsy formula and it does not work. She was wondering if you could call in the patches?

## 2015-10-05 ENCOUNTER — Telehealth: Payer: Self-pay | Admitting: Family Medicine

## 2015-10-09 NOTE — Telephone Encounter (Signed)
P.A. Dorina Hoyerransderm scop approved til 05/24/38, faxed pharmacy, tried to reach pt & unable to leave message

## 2016-01-20 ENCOUNTER — Encounter: Payer: Self-pay | Admitting: Family Medicine

## 2016-01-20 ENCOUNTER — Ambulatory Visit (INDEPENDENT_AMBULATORY_CARE_PROVIDER_SITE_OTHER): Payer: BLUE CROSS/BLUE SHIELD | Admitting: Family Medicine

## 2016-01-20 VITALS — BP 106/72 | HR 57 | Temp 98.2°F | Wt 166.8 lb

## 2016-01-20 DIAGNOSIS — G44221 Chronic tension-type headache, intractable: Secondary | ICD-10-CM

## 2016-01-20 MED ORDER — PREDNISONE 10 MG (21) PO TBPK: ORAL_TABLET | Freq: Every day | ORAL | 0 refills | Status: DC

## 2016-01-20 NOTE — Progress Notes (Signed)
Subjective:    Patient ID: Michele Burke, female    DOB: 1990/06/30, 25 y.o.   MRN: [id number]  HPI Chief Complaint  Patient presents with  . Other    headaches- all summer long. was taking advil and now stomach is hurts. had an infected ear pericing   She is a 25 year old female with a history of autoimmune disorder who is here with complaints of daily headaches "all summer". Describes headache as a dull ache and occasional throbbing. Pain is located at the base of her neck and behind her eyes. Pain is often upon awakening in the morning and sometimes starts after 1-2 hours of being awake. Denies aura. Occasional photosensitivity and nausea associated. Denies worsening headache with activity.  States she switched pillows to see if this helped and it did for awhile. Massages helped for a couple of days.  Advil 600mg  once daily helped for a while but stat she started having "gastritis" issues and is taking protonix 40 mg once a day, each morning for past 6-7 days.    She has cut out advil. Took Excedrin yesterday with some relief.  Today she had IVIG infusion for autoimmune disorder.   History of migraines and went to neurologist in past. She was age 25. She stopped taking abortive medication because it made her feel sluggish. Did not follow up with neurologist. Denies trying preventive medication in past.   States allergies are worse because she moved into an apartment that she thinks has mold.   Has been sneezing. Denies nasal congestion, rhinorrhea, ear pain, sore throat, cough.  Denies fever, chills, unexplained weight loss, chest pain, palpitations, shortness of breath, numbness, tingling or weakness. No visual disturbance, memory issues.   History of exercise induced asthma and bronchitis. No inhaler since HS.  Does not smoke, drink alcohol or use drugs. Caffeine intake does vary some days. Trying to stay well hydrated and does not skip meals. Sleeping 7-8 hours nightly. No sleep  issues. Denies grinding teeth.  Is taking Zoloft for depression. Would like to wean down at some point. Has been at same dose for at least 1 year.   Reviewed allergies, medications, past medical, and social history.    Past Medical History:  Diagnosis Date  . Depression 2008   recurred in HS  . Dysmenorrhea 07/11/2010  . History of menorrhagia 07/11/2010  . Irritable bowel syndrome   . 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 Migraines middle school  . 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 calcium carbonate (OS-CAL) 600 MG TABS Take 600 mg by mouth 2 (two) times daily with a meal.    . cholecalciferol (VITAMIN D) 1000 UNITS tablet Take 1,000 Units by mouth daily.    Marland Kitchen. dexmethylphenidate (FOCALIN XR) 15 MG 24 hr capsule Take 15 mg by mouth daily as needed (to help with focus issues).     . hydroxychloroquine (PLAQUENIL) 200 MG tablet Take 400 mg by mouth daily.     Marland Kitchen. immune globulin, human, (GAMMAGARD S/D) 5 G injection Inject into the vein.    Marland Kitchen. leucovorin (WELLCOVORIN) 5 MG tablet Take 10 mg by mouth 2 (two) times a week.    . medroxyPROGESTERone (DEPO-PROVERA) 150 MG/ML injection Inject 1 mL (150 mg total) into the muscle every 3 (three) months. 2 mL 11  . methotrexate (RHEUMATREX) 2.5 MG tablet Take 10 mg  by mouth once a week. monday    . Multiple Vitamins-Minerals (HAIR/SKIN/NAILS PO) Take 1 tablet by mouth daily.    . sertraline (ZOLOFT) 100 MG tablet Take 100 mg by mouth daily.     Marland Kitchen scopolamine (TRANSDERM-SCOP) 1 MG/3DAYS Place 1 patch (1.5 mg total) onto the skin every 3 (three) days. (Patient not taking: Reported on 01/20/2016) 4 patch 0   No current facility-administered medications on file prior to visit.       Review of Systems Pertinent positives and negatives in the history of present illness.     Objective:   Physical Exam  Constitutional: She is  oriented to person, place, and time. She appears well-developed and well-nourished. No distress.  HENT:  Right Ear: Tympanic membrane and ear canal normal.  Left Ear: Tympanic membrane and ear canal normal.  Nose: Nose normal. Right sinus exhibits no maxillary sinus tenderness and no frontal sinus tenderness. Left sinus exhibits no maxillary sinus tenderness and no frontal sinus tenderness.  Mouth/Throat: Uvula is midline, oropharynx is clear and moist and mucous membranes are normal.  Eyes: Conjunctivae, EOM and lids are normal. Pupils are equal, round, and reactive to light.  Neck: Normal range of motion. Neck supple. No muscular tenderness present.  Cardiovascular: Normal rate, regular rhythm, normal heart sounds and normal pulses.   Pulmonary/Chest: Effort normal and breath sounds normal.  Musculoskeletal:       Cervical back: Normal.  Lymphadenopathy:       Head (right side): No occipital adenopathy present.       Head (left side): No occipital adenopathy present.    She has no cervical adenopathy.       Right: No supraclavicular adenopathy present.       Left: No supraclavicular adenopathy present.  Neurological: She is alert and oriented to person, place, and time. She has normal strength and normal reflexes. No cranial nerve deficit or sensory deficit. Gait normal.  Skin: Skin is warm and dry. No rash noted. No pallor.  Psychiatric: She has a normal mood and affect. Her speech is normal and behavior is normal. Judgment and thought content normal. Cognition and memory are normal.   BP 106/72   Pulse (!) 57   Temp 98.2 F (36.8 C) (Oral)   Wt 166 lb 12.8 oz (75.7 kg)   BMI 24.63 kg/m       Assessment & Plan:  Chronic tension-type headache, intractable  Discussed tension type headache vs migraine, she appears to be having tension headaches.  Discussed using good body mechanics and posture. Staying well hydrated, avoid skipping meals and try to avoid fluctuations in caffeine  intake. She does not appear to be having rebound headache from NSAIDS, she has not been overmedicating as far as I can tell.  Discussed treatment options including steroid course, PT, or muscle relaxants.  Steroid prescription sent to pharmacy to try and break headache cycle.  She will call in 2 weeks to let me know how she is doing since she is in school in [place].  Advised that we may need to refer her to neurology for further evaluation or may a trial of TCAs may be beneficial.  Dr Lynelle Doctor is aware of patient, discussed these options with her.  Patient will follow up if not improving.

## 2016-01-20 NOTE — Patient Instructions (Signed)
Call in 2 weeks to let me know how you are doing after the course of steroids.  Pay close attention to headaches and keep a journal of symptoms, when does it start? How long does it last? What does it feel like and where is the headache located? What makes it worse and what improves symptoms? Follow up if not improving.   Tension Headache A tension headache is a feeling of pain, pressure, or aching that is often felt over the front and sides of the head. The pain can be dull, or it can feel tight (constricting). Tension headaches are not normally associated with nausea or vomiting, and they do not get worse with physical activity. Tension headaches can last from 30 minutes to several days. This is the most common type of headache. CAUSES The exact cause of this condition is not known. Tension headaches often begin after stress, anxiety, or depression. Other triggers may include:  Alcohol.  Too much caffeine, or caffeine withdrawal.  Respiratory infections, such as colds, flu, or sinus infections.  Dental problems or teeth clenching.  Fatigue.  Holding your head and neck in the same position for a long period of time, such as while using a computer.  Smoking. SYMPTOMS Symptoms of this condition include:  A feeling of pressure around the head.  Dull, aching head pain.  Pain felt over the front and sides of the head.  Tenderness in the muscles of the head, neck, and shoulders. DIAGNOSIS This condition may be diagnosed based on your symptoms and a physical exam. Tests may be done, such as a CT scan or an MRI of your head. These tests may be done if your symptoms are severe or unusual. TREATMENT This condition may be treated with lifestyle changes and medicines to help relieve symptoms. HOME CARE INSTRUCTIONS Managing Pain  Take over-the-counter and prescription medicines only as told by your health care provider.  Lie down in a dark, quiet room when you have a headache.  If  directed, apply ice to the head and neck area:  Put ice in a plastic bag.  Place a towel between your skin and the bag.  Leave the ice on for 20 minutes, 2-3 times per day.  Use a heating pad or a hot shower to apply heat to the head and neck area as told by your health care provider. Eating and Drinking  Eat meals on a regular schedule.  Limit alcohol use.  Decrease your caffeine intake, or stop using caffeine. General Instructions  Keep all follow-up visits as told by your health care provider. This is important.  Keep a headache journal to help find out what may trigger your headaches. For example, write down:  What you eat and drink.  How much sleep you get.  Any change to your diet or medicines.  Try massage or other relaxation techniques.  Limit stress.  Sit up straight, and avoid tensing your muscles.  Do not use tobacco products, including cigarettes, chewing tobacco, or e-cigarettes. If you need help quitting, ask your health care provider.  Exercise regularly as told by your health care provider.  Get 7-9 hours of sleep, or the amount recommended by your health care provider. SEEK MEDICAL CARE IF:  Your symptoms are not helped by medicine.  You have a headache that is different from what you normally experience.  You have nausea or you vomit.  You have a fever. SEEK IMMEDIATE MEDICAL CARE IF:  Your headache becomes severe.  You have  repeated vomiting.  You have a stiff neck.  You have a loss of vision.  You have problems with speech.  You have pain in your eye or ear.  You have muscular weakness or loss of muscle control.  You lose your balance or you have trouble walking.  You feel faint or you pass out.  You have confusion.   This information is not intended to replace advice given to you by your health care provider. Make sure you discuss any questions you have with your health care provider.   Document Released: 05/11/2005 Document  Revised: 01/30/2015 Document Reviewed: 09/03/2014 Elsevier Interactive Patient Education [employer]2016 [employer] Inc[employer]

## 2016-01-22 ENCOUNTER — Encounter: Payer: Self-pay | Admitting: Family Medicine

## 2016-01-22 ENCOUNTER — Ambulatory Visit (INDEPENDENT_AMBULATORY_CARE_PROVIDER_SITE_OTHER): Payer: BLUE CROSS/BLUE SHIELD | Admitting: Family Medicine

## 2016-01-22 VITALS — BP 118/74 | HR 72 | Ht 70.0 in | Wt 164.8 lb

## 2016-01-22 DIAGNOSIS — Z5181 Encounter for therapeutic drug level monitoring: Secondary | ICD-10-CM | POA: Diagnosis not present

## 2016-01-22 DIAGNOSIS — G44221 Chronic tension-type headache, intractable: Secondary | ICD-10-CM | POA: Diagnosis not present

## 2016-01-22 DIAGNOSIS — F325 Major depressive disorder, single episode, in full remission: Secondary | ICD-10-CM | POA: Diagnosis not present

## 2016-01-22 DIAGNOSIS — R5383 Other fatigue: Secondary | ICD-10-CM | POA: Diagnosis not present

## 2016-01-22 LAB — CBC WITH DIFFERENTIAL/PLATELET
BASOS PCT: 0 %
Basophils Absolute: 0 cells/uL (ref 0–200)
Eosinophils Absolute: 0 cells/uL — ABNORMAL LOW (ref 15–500)
Eosinophils Relative: 0 %
HEMATOCRIT: 38.7 % (ref 35.0–45.0)
HEMOGLOBIN: 13.4 g/dL (ref 11.7–15.5)
LYMPHS ABS: 1250 {cells}/uL (ref 850–3900)
Lymphocytes Relative: 25 %
MCH: 32.1 pg (ref 27.0–33.0)
MCHC: 34.6 g/dL (ref 32.0–36.0)
MCV: 92.8 fL (ref 80.0–100.0)
MPV: 10.9 fL (ref 7.5–12.5)
Monocytes Absolute: 350 cells/uL (ref 200–950)
Monocytes Relative: 7 %
NEUTROS PCT: 68 %
Neutro Abs: 3400 cells/uL (ref 1500–7800)
Platelets: 231 10*3/uL (ref 140–400)
RBC: 4.17 MIL/uL (ref 3.80–5.10)
RDW: 12.9 % (ref 11.0–15.0)
WBC: 5 10*3/uL (ref 4.0–10.5)

## 2016-01-22 LAB — COMPREHENSIVE METABOLIC PANEL
ALBUMIN: 4.4 g/dL (ref 3.6–5.1)
ALT: 24 U/L (ref 6–29)
AST: 25 U/L (ref 10–30)
Alkaline Phosphatase: 46 U/L (ref 33–115)
BILIRUBIN TOTAL: 0.9 mg/dL (ref 0.2–1.2)
BUN: 10 mg/dL (ref 7–25)
CALCIUM: 9.6 mg/dL (ref 8.6–10.2)
CO2: 22 mmol/L (ref 20–31)
Chloride: 104 mmol/L (ref 98–110)
Creat: 0.74 mg/dL (ref 0.50–1.10)
Glucose, Bld: 88 mg/dL (ref 65–99)
Potassium: 4.4 mmol/L (ref 3.5–5.3)
Sodium: 135 mmol/L (ref 135–146)
Total Protein: 9.8 g/dL — ABNORMAL HIGH (ref 6.1–8.1)

## 2016-01-22 LAB — TSH: TSH: 0.81 mIU/L

## 2016-01-22 MED ORDER — SERTRALINE HCL 100 MG PO TABS: 100.0000 mg | ORAL_TABLET | Freq: Every day | ORAL | 1 refills | Status: DC

## 2016-01-22 NOTE — Patient Instructions (Addendum)
Continue your prednisone course as directed. Be sure to drink plenty of fluids--it doesn't have to be water, just non-caffeinated beverages.  It can be bad to drink excessive amount of plain water in a day (you should be fine with 8-12 glasses/day).  We are checking your electrolytes today, along with blood counts, vitamin D and thyroid tests.  Your thyroid was slightly tender on exam, but no nodules or masses were noted.   Let us know if your have persistent tension/muscular headaches, and would like referral to a physical therapist in [place]. Please let us know the name and/or phone/fax and we can send the referral.

## 2016-01-22 NOTE — Progress Notes (Signed)
Chief Complaint  Patient presents with  . Advice Only    wants to discuss headaches. Currently has rx for zoloft from Dr.Steiner and would like to see if you would take over rx-ing this for her.    Michele Burke is asking for blood work, complaining of fatigue.  H/o anemia, feeling tired all summer, bruising easily (has been taking advil daily for headaches). H/o B12 deficiency; Michele Burke only restarted protonix again 8 days ago due to gastritis symptoms, hasn't been taking longterm.  Needing naps during the day.  Sleeps 8.5-9 hours/night (when Michele Burke gets to bed when Michele Burke wants to), getting up earlier on certain days since school started, more variable schedule.  Michele Burke reports Michele Burke has been trying to drink more water--notes feeling dizzy/lightheaded after drinking a lot of water.  Michele Burke is asking for me to prescribe her Zoloft.  Michele Burke reports that her pediatrician, Dr. Samuel Bouche, originally prescribed her Zoloft.  In HS Michele Burke started seeing Dr. Madaline Guthrie, when depression wasn't adequately controlled, who increased her dose (had been up to 200mg ).  Michele Burke doesn't like Dr. Madaline Guthrie and doesn't want to go back to see her--hoping that we can take over writing the zoloft refills for her. Michele Burke tapered down to 100mg  (went from 150 down to 100mg ) under her care over the last school year, and is doing well. Michele Burke denies side effects to the medication, and reports her moods are well controlled.  Michele Burke saw Vickie on Monday with complaints of daily headaches. Michele Burke is currently on a course of prednisone.  Refer back to last visit--Vickie had consulted with me regarding treatment plan.  Michele Burke reports that since starting on the prednisone, the edge has been taken off, but still has some discomfort.  3/10 pain currently.  "I'm very dependent on Advil"--for wrist pain in the past, and over the summer for her headaches. Helps with the headaches, taking 600mg  once daily.  H/o gastritis, and started having recurrent abdominal pain last week, so restarted protonix.   GI complaints are resolving.  Mold in the roommate's bathroom is getting cleaned out, and sneezing has been improving.  The sinus headaches have improved (forehead).  Last night Michele Burke had joint pains--hips, knees, ankles.  Michele Burke tried ice without benefit, and needed to take tylenol, which helped.  Michele Burke thinks her right knee was hurting from weather changes, and was on her feet all day. Michele Burke previously took prednisone (in high doses) without this side effect or problem.  Labs from [facility] in 08/2015 were reviewed. Normal CBC, LFT's, Cr  PMH, PSH, SH reviewed.  Outpatient Encounter Prescriptions as of 01/22/2016  Medication Sig Note  . calcium carbonate (OS-CAL) 600 MG TABS Take 600 mg by mouth 2 (two) times daily with a meal. 02/28/2015: Takes once daily  . cholecalciferol (VITAMIN D) 1000 UNITS tablet Take 1,000 Units by mouth daily.   . hydroxychloroquine (PLAQUENIL) 200 MG tablet Take 400 mg by mouth daily.    Marland Kitchen immune globulin, human, (GAMMAGARD S/D) 5 G injection Inject into the vein. 02/28/2015: Two days in a row, once a month, IV infusion  . leucovorin (WELLCOVORIN) 5 MG tablet Take 10 mg by mouth 2 (two) times a week. 02/28/2015: Takes the day after methotrexate   . medroxyPROGESTERone (DEPO-PROVERA) 150 MG/ML injection Inject 1 mL (150 mg total) into the muscle every 3 (three) months. 01/22/2016: Through Dr. Cloretta Ned office (due end of September)  . methotrexate (RHEUMATREX) 2.5 MG tablet Take 10 mg by mouth once a week. monday   . Multiple  Vitamins-Minerals (HAIR/SKIN/NAILS PO) Take 1 tablet by mouth daily.   . pantoprazole (PROTONIX) 40 MG tablet Take 40 mg by mouth daily. 01/22/2016: Restarted last week  . predniSONE (STERAPRED UNI-PAK 21 TAB) 10 MG (21) TBPK tablet Take by mouth daily. Tapering dose as manufacturer recommends   . sertraline (ZOLOFT) 100 MG tablet Take 1 tablet (100 mg total) by mouth daily.   . [DISCONTINUED] sertraline (ZOLOFT) 100 MG tablet Take 100 mg by mouth daily.     Marland Kitchen. dexmethylphenidate (FOCALIN XR) 15 MG 24 hr capsule Take 15 mg by mouth daily as needed (to help with focus issues).  02/28/2015: Uses prn when taking exams (to help with distractibility)  . scopolamine (TRANSDERM-SCOP) 1 MG/3DAYS Place 1 patch (1.5 mg total) onto the skin every 3 (three) days. (Patient not taking: Reported on 01/20/2016)    No facility-administered encounter medications on file as of 01/22/2016.    Allergies  Allergen Reactions  . Other     Seasonal allergies   ROS:  No fever, chills. Vomiting, bowel changes, rash. +bruising. Moods good, denies depression. Myalgias yesterday Denies muscle weakness. Headaches, fatigue as per HPI.  PHYSICAL EXAM: BP 118/74 (BP Location: Right Arm, Patient Position: Sitting, Cuff Size: Normal)   Pulse 72   Ht 5\' 10"  (1.778 m)   Wt 164 lb 12.8 oz (74.8 kg)   BMI 23.65 kg/m   Talkative female, in good spirits, in no distress HEENT: PERRL, EOMI, conjunctiva and sclera are clear. OP is clear Sinuses nontender, nasal mucosa is normal. Neck: no lymphadenopathy or mass. Mildly tender over thyroid.  No nodule or mass. Heart: regular rate and rhythm Lungs: clear bilaterally Abdomen: Mild epigastric tenderness. No rebound, guarding or mass Extremities: no edema Psych: normal mood, affect, hygiene and grooming Neuro: alert and oriented, cranial nerves intact normal strength, gait  ASSESSMENT/PLAN:  Other fatigue - Ddx reviewed. Check labs.  - Plan: VITAMIN D 25 Hydroxy (Vit-D Deficiency, Fractures), TSH, Comprehensive metabolic panel, CBC with Differential/Platelet  Medication monitoring encounter - Plan: Comprehensive metabolic panel, CBC with Differential/Platelet  Depression, major, in remission (HCC) - Plan: sertraline (ZOLOFT) 100 MG tablet  Chronic tension-type headache, intractable - complete prednisone course. Consider nortriptylene vs PT (in [place]).  Pt to call us with PT info for referral if/when interested. NSID risks  reviewed, cont PP   TSH, Vitamin D, CBC, chem   Refilled zoloft risks/side effects; Michele Burke has had recurrent depression--recommend long-term use, especially while any stressors. Discussed need to taper off slowly rather than stopping abruptly, but I recommend continuing at her current dose--refilled.   Let us know if your have persistent tension/muscular headaches, and would like referral to a physical therapist in [place]. Please let us know the name and/or phone/fax and we can send the referral.

## 2016-01-23 ENCOUNTER — Other Ambulatory Visit: Payer: Self-pay | Admitting: *Deleted

## 2016-01-23 DIAGNOSIS — R779 Abnormality of plasma protein, unspecified: Secondary | ICD-10-CM

## 2016-01-23 LAB — VITAMIN D 25 HYDROXY (VIT D DEFICIENCY, FRACTURES): Vit D, 25-Hydroxy: 51 ng/mL (ref 30–100)

## 2016-01-24 ENCOUNTER — Ambulatory Visit: Payer: BLUE CROSS/BLUE SHIELD | Admitting: Family Medicine

## 2016-01-24 ENCOUNTER — Other Ambulatory Visit: Payer: BLUE CROSS/BLUE SHIELD

## 2016-01-24 DIAGNOSIS — R779 Abnormality of plasma protein, unspecified: Secondary | ICD-10-CM

## 2016-01-28 ENCOUNTER — Encounter: Payer: Self-pay | Admitting: Family Medicine

## 2016-01-29 LAB — PROTEIN ELECTROPHORESIS,RANDOM URN
CREATININE, URINE: 175 mg/dL (ref 20–320)
PROTEIN CREATININE RATIO: 131 mg/g{creat} (ref 21–161)
TOTAL PROTEIN, URINE: 23 mg/dL (ref 5–24)

## 2016-01-29 LAB — PROTEIN ELECTROPHORESIS, SERUM
ALBUMIN ELP: 4.5 g/dL (ref 3.8–4.8)
ALPHA-1-GLOBULIN: 0.3 g/dL (ref 0.2–0.3)
ALPHA-2-GLOBULIN: 0.8 g/dL (ref 0.5–0.9)
BETA 2: 0.3 g/dL (ref 0.2–0.5)
BETA GLOBULIN: 0.5 g/dL (ref 0.4–0.6)
Gamma Globulin: 3.3 g/dL — ABNORMAL HIGH (ref 0.8–1.7)
Total Protein, Serum Electrophoresis: 9.5 g/dL — ABNORMAL HIGH (ref 6.1–8.1)

## 2016-08-07 ENCOUNTER — Other Ambulatory Visit: Payer: Self-pay | Admitting: Family Medicine

## 2016-08-07 DIAGNOSIS — F325 Major depressive disorder, single episode, in full remission: Secondary | ICD-10-CM

## 2016-08-07 NOTE — Telephone Encounter (Signed)
Needs to have either physical or med check scheduled within the next 6 months (last addressed end of August, no f/u is scheduled).  Ok to refill x 6 mos, but needs to be seen for further refills after

## 2016-08-07 NOTE — Telephone Encounter (Signed)
Is this okay to refill? 

## 2016-08-10 NOTE — Telephone Encounter (Signed)
See my notes from last week

## 2016-08-10 NOTE — Telephone Encounter (Signed)
Is this okay to refill? 

## 2016-12-19 ENCOUNTER — Other Ambulatory Visit: Payer: Self-pay | Admitting: Family Medicine

## 2016-12-19 DIAGNOSIS — F325 Major depressive disorder, single episode, in full remission: Secondary | ICD-10-CM

## 2016-12-21 NOTE — Telephone Encounter (Signed)
See message from March refill request, where she was asked to schedule.  Was she ever contacted to schedule appt in March? Okay for #30, and needs med check (or CPE). Can refill #90 if you can reach her and appt is actually scheduled (and if she prefers to keep 90d supply)

## 2016-12-21 NOTE — Telephone Encounter (Signed)
Voicemail is full and cannot leave a message-left detailed message on home machine asking patient to please call and schedule ASAP and letting her know that I can only refill 30 days worth of her medication until she calls and schedules an appt.

## 2016-12-21 NOTE — Telephone Encounter (Signed)
Is this okay to refill? No appt is scheduled.

## 2017-01-20 ENCOUNTER — Other Ambulatory Visit: Payer: Self-pay | Admitting: Family Medicine

## 2017-01-20 DIAGNOSIS — F325 Major depressive disorder, single episode, in full remission: Secondary | ICD-10-CM

## 2017-02-02 ENCOUNTER — Encounter: Payer: Self-pay | Admitting: *Deleted

## 2017-02-16 NOTE — Progress Notes (Signed)
Chief Complaint  Patient presents with  . Depression    med check. No concerns.     Patient presents for med check. She hasn't been seen in over a year, and needs refills on her Zoloft.  She has been on Zoloft since high school. Originally saw Dr. Albertine Patricia, but ultimately her pediatrician started prescribing medication.  Her dose was tapered down from 259m to 1023mabout 2 years ago. She is doing well and moods are well controlled.  She was last seen over a year ago, at which time she was complaining of fatigue. She had normal CBC, Vitamin D, TSH and chem panel (except for elevated protein level; SPEP/UPEP results suggested due to inflammatory response).  She reports good energy levels now.  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  Stomach symptoms are much better now. She has tight muscles in her neck.  She had seen PT for a while which helped.  She no longer needs to take much advil, less than once a week (just when she gets IVIG as pre-med). No longer getting regular headaches.  Dermatomyositis:  She has been off methotrexate since November, and doing well since then.  She takes IVIG every 5 weeks, and hopes to eventually stretch that out.  Slight rash on neck, no other complaints. Last labs 03/2016--normal LFT's, CBC. ESR 52  Pap smear normal in 11/2016 She is on Depo Provera (for h/o menorrhagia/dysmenorrhea)  Immunization History  Administered Date(s) Administered  . HPV 9-valent 02/28/2015  . Influenza,inj,Quad PF,6+ Mos 02/28/2015  . Influenza-Unspecified 01/27/2017  . Pneumococcal Conjugate-13 02/28/2015   She doesn't recall when her last tetanus shot was (sent a text to check with her mother)  PMH, PSH, SH reviewed, FH updated  Outpatient Encounter Prescriptions as of 02/17/2017  Medication Sig Note  . calcium carbonate (OS-CAL) 600 MG TABS Take 600 mg by mouth 2 (two) times daily with a meal. 02/28/2015: Takes once  daily  . cholecalciferol (VITAMIN D) 1000 UNITS tablet Take 1,000 Units by mouth daily.   . hydroxychloroquine (PLAQUENIL) 200 MG tablet Take 400 mg by mouth daily.    . Marland Kitchenmmune globulin, human, (GAMMAGARD S/D) 5 G injection Inject into the vein. 02/17/2017: 2 days in a row, once every 5 weeks now  . medroxyPROGESTERone (DEPO-PROVERA) 150 MG/ML injection Inject 1 mL (150 mg total) into the muscle every 3 (three) months. 02/17/2017: Through GYN's office  . Multiple Vitamins-Minerals (HAIR/SKIN/NAILS PO) Take 1 tablet by mouth daily.   . sertraline (ZOLOFT) 100 MG tablet Take 1 tablet (100 mg total) by mouth daily.   . [DISCONTINUED] sertraline (ZOLOFT) 100 MG tablet TAKE 1 TABLET (100 MG TOTAL) BY MOUTH DAILY.   . Marland Kitchenexmethylphenidate (FOCALIN XR) 15 MG 24 hr capsule Take 15 mg by mouth daily as needed (to help with focus issues).  02/28/2015: Uses prn when taking exams (to help with distractibility)  . pantoprazole (PROTONIX) 40 MG tablet Take 40 mg by mouth daily. 02/17/2017: Uses prn for flares  . predniSONE (STERAPRED UNI-PAK 21 TAB) 10 MG (21) TBPK tablet Take by mouth daily. Tapering dose as manufacturer recommends (Patient not taking: Reported on 02/17/2017) 02/17/2017: Last prednisone use was for acute illness (pneumonia), not related to flare of dermatomyositis)  . scopolamine (TRANSDERM-SCOP) 1 MG/3DAYS Place 1 patch (1.5 mg total) onto the skin every 3 (three) days. (Patient not taking: Reported on 01/20/2016)   . [DISCONTINUED] leucovorin (WELLCOVORIN) 5 MG tablet Take 10  mg by mouth 2 (two) times a week. 02/28/2015: Takes the day after methotrexate   . [DISCONTINUED] methotrexate (RHEUMATREX) 2.5 MG tablet Take 10 mg by mouth once a week. monday    No facility-administered encounter medications on file as of 02/17/2017.    Allergies  Allergen Reactions  . Other     Seasonal allergies   ROS: no fever, chills, dizziness, syncope. No significant headaches. No nausea, vomiting, bowel changes,  bleeding, bruising. Slight rash on neck, no myalgias, arthralgias. Moods are good. No chest pain, palpitations, URI or allergy symptoms. No cough, shortness of breath.  PHYSICAL EXAM:  BP 124/84 (BP Location: Left Arm, Patient Position: Sitting, Cuff Size: Normal)   Pulse 60   Ht _0  (1.778 m)   Wt 161 lb 9.6 oz (73.3 kg)   BMI 23.19 kg/m   Wt Readings from Last 3 Encounters:  02/17/17 161 lb 9.6 oz (73.3 kg)  01/22/16 164 lb 12.8 oz (74.8 kg)  01/20/16 166 lb 12.8 oz (75.7 kg)   Well appearing, pleasant female in no distress HEENT: conjunctiva and sclera are clear, EOMI, OP clear Neck: No lymphadenopathy, thyromegaly or mass Heart: regular rate and rhythm, no murmur Lungs: clear bilaterally Back: no spinal or CVA tenderness Abdomen: soft, nontender, no organomegaly or mass Extremities: no edema Skin: normal turgor, no visible rash or lesions Psych: normal mood, affect, hygiene and grooming Neuro: alert and oriented, cranial nerves intact, normal gait.  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 recent normal pap smear.  F/u 1 year for med check, sooner prn. Advised to check with mom on date of last tetanus to see when will be due for another ([place] [facility]  25 min visit, more than 1/2 spent counseling. Counseled re: meds, remission, consideration of trial of tapering off, since no h/o recurrent depression and other medical issues are quiescent currently. She does have potential increase in stressors (applying to grad school etc), so will hold off on making changes currently. Risks/side effects of  meds reviewed. Encouraged continue calcium, D and weight-bearing exercise to minimize bone risks from her Depo Provera She didn't feel like she had good information from rheum re: her lab results, so Care Everywhere results reviewed with her (sed rates)  All questions answered.

## 2017-02-17 ENCOUNTER — Ambulatory Visit (INDEPENDENT_AMBULATORY_CARE_PROVIDER_SITE_OTHER): Payer: BLUE CROSS/BLUE SHIELD | Admitting: Family Medicine

## 2017-02-17 ENCOUNTER — Encounter: Payer: Self-pay | Admitting: Family Medicine

## 2017-02-17 VITALS — BP 124/84 | HR 60 | Ht 70.0 in | Wt 161.6 lb

## 2017-02-17 DIAGNOSIS — M3300 Juvenile dermatopolymyositis, organ involvement unspecified: Secondary | ICD-10-CM

## 2017-02-17 DIAGNOSIS — F325 Major depressive disorder, single episode, in full remission: Secondary | ICD-10-CM | POA: Diagnosis not present

## 2017-02-17 DIAGNOSIS — N946 Dysmenorrhea, unspecified: Secondary | ICD-10-CM | POA: Diagnosis not present

## 2017-02-17 MED ORDER — SERTRALINE HCL 100 MG PO TABS: 100.0000 mg | ORAL_TABLET | Freq: Every day | ORAL | 3 refills | Status: DC

## 2017-02-17 NOTE — Patient Instructions (Addendum)
Continue the sertraline, calcium supplements, vitamin D and regular exercise. Let me know if you have any problems.  Consider tapering dose at some point when life stressors and health is very stable.  I don't have any record of when your last tetanus shot was.  These are recommended every 10 years.  If/when you get a chance, send [place] an email with the date of your last tetanus so that we will know when you are due to have another one.

## 2018-02-03 ENCOUNTER — Ambulatory Visit (INDEPENDENT_AMBULATORY_CARE_PROVIDER_SITE_OTHER): Payer: 59 | Admitting: Family Medicine

## 2018-02-03 ENCOUNTER — Other Ambulatory Visit (INDEPENDENT_AMBULATORY_CARE_PROVIDER_SITE_OTHER): Payer: 59

## 2018-02-03 ENCOUNTER — Encounter: Payer: Self-pay | Admitting: Family Medicine

## 2018-02-03 VITALS — BP 104/76 | HR 84 | Ht 70.0 in | Wt 151.0 lb

## 2018-02-03 DIAGNOSIS — M2550 Pain in unspecified joint: Secondary | ICD-10-CM | POA: Diagnosis not present

## 2018-02-03 DIAGNOSIS — M899 Disorder of bone, unspecified: Secondary | ICD-10-CM

## 2018-02-03 DIAGNOSIS — G2589 Other specified extrapyramidal and movement disorders: Secondary | ICD-10-CM | POA: Diagnosis not present

## 2018-02-03 LAB — CBC WITH DIFFERENTIAL/PLATELET
BASOS ABS: 0 10*3/uL (ref 0.0–0.1)
Basophils Relative: 0.8 % (ref 0.0–3.0)
Eosinophils Absolute: 0.1 10*3/uL (ref 0.0–0.7)
Eosinophils Relative: 1.6 % (ref 0.0–5.0)
HEMATOCRIT: 40.7 % (ref 36.0–46.0)
Hemoglobin: 14.3 g/dL (ref 12.0–15.0)
LYMPHS PCT: 41.9 % (ref 12.0–46.0)
Lymphs Abs: 1.8 10*3/uL (ref 0.7–4.0)
MCHC: 35.2 g/dL (ref 30.0–36.0)
MCV: 90.9 fl (ref 78.0–100.0)
MONOS PCT: 7.6 % (ref 3.0–12.0)
Monocytes Absolute: 0.3 10*3/uL (ref 0.1–1.0)
NEUTROS ABS: 2 10*3/uL (ref 1.4–7.7)
Neutrophils Relative %: 48.1 % (ref 43.0–77.0)
PLATELETS: 201 10*3/uL (ref 150.0–400.0)
RBC: 4.47 Mil/uL (ref 3.87–5.11)
RDW: 12.7 % (ref 11.5–15.5)
WBC: 4.2 10*3/uL (ref 4.0–10.5)

## 2018-02-03 LAB — C-REACTIVE PROTEIN: CRP: 0.1 mg/dL — ABNORMAL LOW (ref 0.5–20.0)

## 2018-02-03 LAB — COMPREHENSIVE METABOLIC PANEL
ALT: 20 U/L (ref 0–35)
AST: 20 U/L (ref 0–37)
Albumin: 4.9 g/dL (ref 3.5–5.2)
Alkaline Phosphatase: 49 U/L (ref 39–117)
BILIRUBIN TOTAL: 0.9 mg/dL (ref 0.2–1.2)
BUN: 12 mg/dL (ref 6–23)
CALCIUM: 9.8 mg/dL (ref 8.4–10.5)
CHLORIDE: 102 meq/L (ref 96–112)
CO2: 25 meq/L (ref 19–32)
Creatinine, Ser: 0.91 mg/dL (ref 0.40–1.20)
GFR: 80.17 mL/min (ref >60.00)
Glucose, Bld: 85 mg/dL (ref 70–99)
POTASSIUM: 3.9 meq/L (ref 3.5–5.1)
Sodium: 136 mEq/L (ref 135–145)
Total Protein: 8.9 g/dL — ABNORMAL HIGH (ref 6.0–8.3)

## 2018-02-03 LAB — URIC ACID: Uric Acid, Serum: 2.8 mg/dL (ref 2.4–7.0)

## 2018-02-03 LAB — IBC PANEL
IRON: 70 ug/dL (ref 42–145)
Saturation Ratios: 19.2 % — ABNORMAL LOW (ref 20.0–50.0)
TRANSFERRIN: 261 mg/dL (ref 212.0–360.0)

## 2018-02-03 LAB — SEDIMENTATION RATE: SED RATE: 22 mm/h — AB (ref 0–20)

## 2018-02-03 LAB — T3, FREE: T3, Free: 3.7 pg/mL (ref 2.3–4.2)

## 2018-02-03 LAB — TSH: TSH: 1.48 u[IU]/mL (ref 0.35–4.50)

## 2018-02-03 LAB — T4, FREE: FREE T4: 0.77 ng/dL (ref 0.60–1.60)

## 2018-02-03 MED ORDER — GABAPENTIN 100 MG PO CAPS: 200.0000 mg | ORAL_CAPSULE | Freq: Every day | ORAL | 3 refills | Status: DC

## 2018-02-03 MED ORDER — VITAMIN D (ERGOCALCIFEROL) 1.25 MG (50000 UNIT) PO CAPS: 50000.0000 [IU] | ORAL_CAPSULE | ORAL | 0 refills | Status: DC

## 2018-02-03 NOTE — Patient Instructions (Addendum)
Great to meet you  Labs downstairs Exercises 3 times a week.  On wall with heels, butt shoulder and head touching for a goal of 5 minutes daily  Ice 20 minutes 2 times daily. Usually after activity and before bed. Turmeric 500mg  daily  Once weekly vitamin D for 12 weeks Stop the daily vitamin D We will contact you on the labs.  See me again in 4 weeks but we will be talking

## 2018-02-03 NOTE — Progress Notes (Signed)
[name] [address] [place], [place] [address] Phone: [phone] Subjective:   Michele Burke, am serving as a scribe for Dr. Antoine Primas.  CC: neck pain   [id number]  Michele Burke is a 27 y.o. female coming in with complaint of neck pain. Patient states that she has been going to pain specialist in [place] where she had injections. No relief. Did use a muscle relaxor but did not have relief. Does get relief with Advil. Pain started a year ago. Does get headaches, three days a week. Radiating symptoms going down into left hand.   Patient has a history of having a juvenile dermatomyositis.  Has seen other providers for her neck pain previously and was sent to pain management where she did have injections including trigger point and possibly a cervical epidural with no significant benefit.  She continues to have pain most of the time.  Can affect even daily activities such as her studying or with her working here in the near future.  Concerned that this will not be getting better.  Patient has had an MRI and brings it with her.  MRI of the cervical spine was independently visualized by me.  MRI showed no significant bony abnormality.     Past Medical History:  Diagnosis Date  . Depression 2008   recurred in HS  . Dysmenorrhea 07/11/2010  . History of menorrhagia 07/11/2010  . Irritable bowel syndrome   . 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 Migraines middle school  . 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 COLONOSCOPY  2014   Dr. Jerral Ralph  . KNEE SURGERY Right    plica removal, arthroscopic  . TONSILLECTOMY  2000  . ULNA OSTEOTOMY Left 09/2014   Dr. Orlan Leavens; ulna shortening  . WISDOM TOOTH EXTRACTION  2011   Social History   Socioeconomic History  . Marital status: Single    Spouse name: Not on file  . Number of children: Not on file  . Years  of education: Not on file  . Highest education level: Not on file  Occupational History  . Occupation: Consulting civil engineer  Social Needs  . Financial resource strain: Not on file  . Food insecurity:    Worry: Not on file    Inability: Not on file  . Transportation needs:    Medical: Not on file    Non-medical: Not on file  Tobacco Use  . Smoking status: Never Smoker  . Smokeless tobacco: Never Used  Substance and Sexual Activity  . Alcohol use: No    Alcohol/week: 0.0 standard drinks    Comment: infrequent  . Drug use: No  . Sexual activity: Never    Birth control/protection: Other-see comments    Comment: CELIBATE  Lifestyle  . Physical activity:    Days per week: Not on file    Minutes per session: Not on file  . Stress: Not on file  Relationships  . Social connections:    Talks on phone: Not on file    Gets together: Not on file    Attends religious service: Not on file    Active member of club or organization: Not on file    Attends meetings of clubs or organizations: Not on file    Relationship status: Not on file  Other Topics Concern  . Not on file  Social History Narrative   Graduated from [employer].  Applying to grad school for school counseling (Masters in Education).  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   Allergies  Allergen Reactions  . Other     Seasonal allergies   Family History  Problem Relation Age of Onset  . Hypertension Father   . Hypothyroidism Mother   . Hyperlipidemia Mother        borderline; good HDL, borderline LDL  . Allergies Mother   . Lupus Maternal Grandmother   . Heart disease Maternal Grandfather        CHF  . COPD Maternal Grandfather   . Asthma Maternal Grandfather   . Cancer Maternal Grandfather        thymic  . Stroke Paternal Grandfather [id number]  . Diabetes Paternal Grandfather     Current Outpatient Medications (Endocrine &  Metabolic):  .  medroxyPROGESTERone (DEPO-PROVERA) 150 MG/ML injection, Inject 1 mL (150 mg total) into the muscle every 3 (three) months. .  predniSONE (STERAPRED UNI-PAK 21 TAB) 10 MG (21) TBPK tablet, Take by mouth daily. Tapering dose as manufacturer recommends      Current Outpatient Medications (Other):  .  calcium carbonate (OS-CAL) 600 MG TABS, Take 600 mg by mouth 2 (two) times daily with a meal. .  cholecalciferol (VITAMIN D) 1000 UNITS tablet, Take 1,000 Units by mouth daily. Marland Kitchen.  dexmethylphenidate (FOCALIN XR) 15 MG 24 hr capsule, Take 15 mg by mouth daily as needed (to help with focus issues).  .  hydroxychloroquine (PLAQUENIL) 200 MG tablet, Take 400 mg by mouth daily.  Marland Kitchen.  immune globulin, human, (GAMMAGARD S/D) 5 G injection, Inject into the vein. .  Multiple Vitamins-Minerals (HAIR/SKIN/NAILS PO), Take 1 tablet by mouth daily. .  pantoprazole (PROTONIX) 40 MG tablet, Take 40 mg by mouth daily. Marland Kitchen.  scopolamine (TRANSDERM-SCOP) 1 MG/3DAYS, Place 1 patch (1.5 mg total) onto the skin every 3 (three) days. Marland Kitchen.  sertraline (ZOLOFT) 100 MG tablet, Take 1 tablet (100 mg total) by mouth daily. Marland Kitchen.  gabapentin (NEURONTIN) 100 MG capsule, Take 2 capsules (200 mg total) by mouth at bedtime. .  Vitamin D, Ergocalciferol, (DRISDOL) 50000 units CAPS capsule, Take 1 capsule (50,000 Units total) by mouth every 7 (seven) days.    Past medical history, social, surgical and family history all reviewed in electronic medical record.  No pertanent information unless stated regarding to the chief complaint.   Review of Systems:  No headache, visual changes, nausea, vomiting, diarrhea, constipation, dizziness, abdominal pain, skin rash, fevers, chills, night sweats, weight loss,, chest pain, shortness of breath, mood changes.  Positive muscle aches, joint swelling, body aches, swollen lymph nodes  Objective  Blood pressure 104/76, pulse 84, height 5\' 10"  (1.778 m), weight 151 lb (68.5 kg), SpO2 98  %.    General: No apparent distress alert and oriented x3 mood and affect normal, dressed appropriately.  HEENT: Pupils equal, extraocular movements intact fullness of the thyroid noted Respiratory: Patient's speak in full sentences and does not appear short of breath  Cardiovascular: No lower extremity edema, non tender, no erythema  Skin: Warm dry intact with no signs of infection or rash on extremities or on axial skeleton.  Abdomen: Soft nontender  Neuro: Cranial nerves II through XII are intact, neurovascularly intact in all extremities with 2+ DTRs and 2+ pulses.  Lymph: No lymphadenopathy of posterior or anterior cervical chain or axillae bilaterally.  Gait normal with good balance and coordination.  MSK:  tender with full range of motion and good stability and symmetric strength and tone of , elbows, wrist, hip, knee and ankles bilaterally.  Left shoulder does have some atrophy.  Patient does have some significant scapular winging noted.  Significant weakness of the shoulder girdle.  Scapular dyskinesis noted.  Patient has what appears to be instability of the shoulder as well.   [id number]; 15 additional minutes spent for Therapeutic exercises as stated in above notes.  This included exercises focusing on stretching, strengthening, with significant focus on eccentric aspects.   Long term goals include an improvement in range of motion, strength, endurance as well as avoiding reinjury. Patient's frequency would include in 1-2 times a day, 3-5 times a week for a duration of 6-12 weeks. Shoulder Exercises that included:  Basic scapular stabilization to include adduction and depression of scapula Scaption, focusing on proper movement and good control Internal and External rotation utilizing a theraband, with elbow tucked at side entire time Rows with theraband which was given   Proper technique shown and discussed handout in great detail with ATC.  All questions were discussed and answered.       Impression and Recommendations:     This case required medical decision making of moderate complexity. The above documentation has been reviewed and is accurate and complete Judi Saa, DO       Note: This dictation was prepared with Dragon dictation along with smaller phrase technology. Any transcriptional errors that result from this process are unintentional.

## 2018-02-03 NOTE — Assessment & Plan Note (Signed)
Scapular dyskinesis.  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  Discussed the possibility of nerve.

## 2018-02-10 LAB — VITAMIN D 1,25 DIHYDROXY
VITAMIN D3 1, 25 (OH): 51 pg/mL
Vitamin D 1, 25 (OH)2 Total: 51 pg/mL (ref 18–72)

## 2018-02-10 LAB — PTH, INTACT AND CALCIUM
Calcium: 10 mg/dL (ref 8.6–10.2)
PTH: 24 pg/mL (ref 14–64)

## 2018-02-10 LAB — RHEUMATOID FACTOR: Rhuematoid fact SerPl-aCnc: <14 IU/mL (ref <14)

## 2018-02-10 LAB — CALCIUM, IONIZED: Calcium, Ion: 5.17 mg/dL (ref 4.8–5.6)

## 2018-02-10 LAB — CYCLIC CITRUL PEPTIDE ANTIBODY, IGG: Cyclic Citrullin Peptide Ab: <16 UNITS

## 2018-02-10 LAB — ANA: ANA: POSITIVE — AB

## 2018-02-10 LAB — ANTI-NUCLEAR AB-TITER (ANA TITER): ANA Titer 1: 1:1280 {titer} — ABNORMAL HIGH

## 2018-02-10 LAB — ANGIOTENSIN CONVERTING ENZYME: ANGIOTENSIN-CONVERTING ENZYME: 28 U/L (ref 9–67)

## 2018-02-15 ENCOUNTER — Encounter: Payer: Self-pay | Admitting: Family Medicine

## 2018-02-17 ENCOUNTER — Encounter: Payer: BLUE CROSS/BLUE SHIELD | Admitting: Family Medicine

## 2018-02-25 ENCOUNTER — Other Ambulatory Visit: Payer: Self-pay | Admitting: Family Medicine

## 2018-03-02 ENCOUNTER — Other Ambulatory Visit: Payer: Self-pay | Admitting: Family Medicine

## 2018-03-02 DIAGNOSIS — F325 Major depressive disorder, single episode, in full remission: Secondary | ICD-10-CM

## 2018-03-06 NOTE — Progress Notes (Signed)
[name] [address] [place], [place] [address] Phone: [phone] Subjective:    I [name] am serving as a Neurosurgeon for Dr. Antoine Primas.   I'm seeing this patient by the request  of:    CC: All of her pain, neck and shoulder pain  [id number]  Michele Burke is a 27 y.o. female coming in with complaint of polyarthralgia. States that she's doing ok. Still having numbness.  Mostly in the left arm.  Working with physical therapist.  They are concerned for more of a thoracic outlet syndrome.  Patient feels that her neck is significantly tight on the left side as well.  Patient denies though any true weakness.  States that it continues to have muscle fatigue on a fairly regular basis though.     Past Medical History:  Diagnosis Date  . Depression 2008   recurred in HS  . Dysmenorrhea 07/11/2010  . History of menorrhagia 07/11/2010  . Irritable bowel syndrome   . 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 Migraines middle school  . 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 COLONOSCOPY  2014   Dr. Jerral Ralph  . KNEE SURGERY Right    plica removal, arthroscopic  . TONSILLECTOMY  2000  . ULNA OSTEOTOMY Left 09/2014   Dr. Orlan Leavens; ulna shortening  . WISDOM TOOTH EXTRACTION  2011   Social History   Socioeconomic History  . Marital status: Single    Spouse name: Not on file  . Number of children: Not on file  . Years of education: Not on file  . Highest education level: Not on file  Occupational History  . Occupation: Consulting civil engineer  Social Needs  . Financial resource strain: Not on file  . Food insecurity:    Worry: Not on file    Inability: Not on file  . Transportation needs:    Medical: Not on file    Non-medical: Not on file  Tobacco Use  . Smoking status: Never Smoker  . Smokeless tobacco: Never Used  Substance and Sexual Activity  . Alcohol use:  No    Alcohol/week: 0.0 standard drinks    Comment: infrequent  . Drug use: No  . Sexual activity: Never    Birth control/protection: Other-see comments    Comment: CELIBATE  Lifestyle  . Physical activity:    Days per week: Not on file    Minutes per session: Not on file  . Stress: Not on file  Relationships  . Social connections:    Talks on phone: Not on file    Gets together: Not on file    Attends religious service: Not on file    Active member of club or organization: Not on file    Attends meetings of clubs or organizations: Not on file    Relationship status: Not on file  Other Topics Concern  . Not on file  Social History Narrative   Graduated from [employer].  Applying to grad school for school counseling (Masters in Education).  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   Allergies  Allergen Reactions  . Other     Seasonal allergies   Family History  Problem Relation Age of Onset  . Hypertension Father   . Hypothyroidism Mother   . Hyperlipidemia Mother  borderline; good HDL, borderline LDL  . Allergies Mother   . Lupus Maternal Grandmother   . Heart disease Maternal Grandfather        CHF  . COPD Maternal Grandfather   . Asthma Maternal Grandfather   . Cancer Maternal Grandfather        thymic  . Stroke Paternal Grandfather 101  . Diabetes Paternal Grandfather     Current Outpatient Medications (Endocrine & Metabolic):  .  medroxyPROGESTERone (DEPO-PROVERA) 150 MG/ML injection, Inject 1 mL (150 mg total) into the muscle every 3 (three) months. .  predniSONE (STERAPRED UNI-PAK 21 TAB) 10 MG (21) TBPK tablet, Take by mouth daily. Tapering dose as manufacturer recommends      Current Outpatient Medications (Other):  .  calcium carbonate (OS-CAL) 600 MG TABS, Take 600 mg by mouth 2 (two) times daily with a meal. .  cholecalciferol (VITAMIN D) 1000  UNITS tablet, Take 1,000 Units by mouth daily. Marland Kitchen  dexmethylphenidate (FOCALIN XR) 15 MG 24 hr capsule, Take 15 mg by mouth daily as needed (to help with focus issues).  .  gabapentin (NEURONTIN) 100 MG capsule, TAKE 2 CAPSULES (200 MG TOTAL) BY MOUTH AT BEDTIME. .  hydroxychloroquine (PLAQUENIL) 200 MG tablet, Take 400 mg by mouth daily.  Marland Kitchen  immune globulin, human, (GAMMAGARD S/D) 5 G injection, Inject into the vein. .  Multiple Vitamins-Minerals (HAIR/SKIN/NAILS PO), Take 1 tablet by mouth daily. .  pantoprazole (PROTONIX) 40 MG tablet, Take 40 mg by mouth daily. Marland Kitchen  scopolamine (TRANSDERM-SCOP) 1 MG/3DAYS, Place 1 patch (1.5 mg total) onto the skin every 3 (three) days. Marland Kitchen  sertraline (ZOLOFT) 100 MG tablet, Take 1 tablet (100 mg total) by mouth daily. .  Vitamin D, Ergocalciferol, (DRISDOL) 50000 units CAPS capsule, Take 1 capsule (50,000 Units total) by mouth every 7 (seven) days.    Past medical history, social, surgical and family history all reviewed in electronic medical record.  No pertanent information unless stated regarding to the chief complaint.   Review of Systems:  No headache, visual changes, nausea, vomiting, diarrhea, constipation, dizziness, abdominal pain, skin rash, fevers, chills, night sweats, weight loss, swollen lymph nodes, body aches, joint swelling, , chest pain, shortness of breath, mood changes.  Positive muscle aches  Objective  Blood pressure 100/70, pulse 74, height 5\' 10"  (1.778 m), weight 155 lb (70.3 kg), SpO2 97 %.   General: No apparent distress alert and oriented x3 mood and affect normal, dressed appropriately.  HEENT: Pupils equal, extraocular movements intact  Respiratory: Patient's speak in full sentences and does not appear short of breath  Cardiovascular: No lower extremity edema, non tender, no erythema  Skin: Warm dry intact with no signs of infection or rash on extremities or on axial skeleton.  Abdomen: Soft nontender  Neuro: Cranial nerves  II through XII are intact, neurovascularly intact in all extremities with 2+ DTRs and 2+ pulses.  Lymph: No lymphadenopathy of posterior or anterior cervical chain or axillae bilaterally.  Gait normal with good balance and coordination.  MSK:  Non tender with full range of motion and good stability and symmetric strength and tone of shoulders, elbows, wrist, hip, knee and ankles bilaterally.  Hypermobility of multiple joints  Neck exam does have some increase in range of motion.  Severe tenderness to palpation though over the paraspinal musculature especially the left side of the trapezius negative Spurling's.  Neurovascular intact distally.  Osteopathic findings C6 flexed rotated and side bent left T3 extended rotated and side bent  left inhaled third rib T9 extended rotated and side bent left L4 flexed rotated and side bent right Sacrum right on right     Impression and Recommendations:     This case required medical decision making of moderate complexity. The above documentation has been reviewed and is accurate and complete Judi Saa, DO       Note: This dictation was prepared with Dragon dictation along with smaller phrase technology. Any transcriptional errors that result from this process are unintentional.

## 2018-03-07 ENCOUNTER — Ambulatory Visit (INDEPENDENT_AMBULATORY_CARE_PROVIDER_SITE_OTHER): Payer: 59 | Admitting: Family Medicine

## 2018-03-07 ENCOUNTER — Encounter: Payer: Self-pay | Admitting: Family Medicine

## 2018-03-07 DIAGNOSIS — M3300 Juvenile dermatopolymyositis, organ involvement unspecified: Secondary | ICD-10-CM | POA: Diagnosis not present

## 2018-03-07 DIAGNOSIS — M357 Hypermobility syndrome: Secondary | ICD-10-CM | POA: Diagnosis not present

## 2018-03-07 DIAGNOSIS — M999 Biomechanical lesion, unspecified: Secondary | ICD-10-CM | POA: Diagnosis not present

## 2018-03-07 DIAGNOSIS — G2589 Other specified extrapyramidal and movement disorders: Secondary | ICD-10-CM | POA: Diagnosis not present

## 2018-03-07 NOTE — Assessment & Plan Note (Signed)
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  Discussed continuing the vitamin supplementation, icing regimen, posture and ergonomics.  Continue physical therapy.  Responding well to manipulation.  Follow-up again in 4 weeks.

## 2018-03-07 NOTE — Patient Instructions (Signed)
Good to  See you  Overall you should do well  I moved the rib and I hope it helps Prilosec daily for 2 weeks Continue the other vitamins See me again in 4-5 weeks

## 2018-03-07 NOTE — Assessment & Plan Note (Signed)
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  Discussed posture and ergonomics.  Follow-up again in 4 to 8 weeks

## 2018-03-07 NOTE — Assessment & Plan Note (Signed)
Decision today to treat with OMT was based on Physical Exam  After verbal consent patient was treated with HVLA, ME, FPR techniques in cervical, thoracic, rib areas  Patient tolerated the procedure well with improvement in symptoms  Patient given exercises, stretches and lifestyle modifications  See medications in patient instructions if given  Patient will follow up in 4-8 weeks 

## 2018-03-07 NOTE — Assessment & Plan Note (Signed)
Scapular dyskinesis as well.  Discussed icing regimen and home exercise.  Discussed which activities to do which wants to avoid.  Follow-up again in 4 to 8 weeks

## 2018-04-05 NOTE — Progress Notes (Deleted)
Tawana ScaleZach Yareth Macdonnell D.O. [facility] [address] [place], [place] [address] Phone: [phone] Subjective:    I'm seeing this patient by the request  of:    CC:   [id number]:Subjective  Michele Burke is a 27 y.o. female coming in with complaint of ***  Onset-  Location Duration-  Character- Aggravating factors- Reliving factors-  Therapies tried-  Severity-     Past Medical History:  Diagnosis Date  . Depression 2008   recurred in HS  . Dysmenorrhea 07/11/2010  . History of menorrhagia 07/11/2010  . Irritable bowel syndrome   . 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 Migraines middle school  . 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 COLONOSCOPY  2014   Dr. Jerral RalphSchooler--normal  . KNEE SURGERY Right    plica removal, arthroscopic  . TONSILLECTOMY  2000  . ULNA OSTEOTOMY Left 09/2014   Dr. Orlan Leavensrtman; ulna shortening  . WISDOM TOOTH EXTRACTION  2011   Social History   Socioeconomic History  . Marital status: Single    Spouse name: Not on file  . Number of children: Not on file  . Years of education: Not on file  . Highest education level: Not on file  Occupational History  . Occupation: Consulting civil engineerstudent  Social Needs  . Financial resource strain: Not on file  . Food insecurity:    Worry: Not on file    Inability: Not on file  . Transportation needs:    Medical: Not on file    Non-medical: Not on file  Tobacco Use  . Smoking status: Never Smoker  . Smokeless tobacco: Never Used  Substance and Sexual Activity  . Alcohol use: No    Alcohol/week: 0.0 standard drinks    Comment: infrequent  . Drug use: No  . Sexual activity: Never    Birth control/protection: Other-see comments    Comment: CELIBATE  Lifestyle  . Physical activity:    Days per week: Not on file    Minutes per session: Not on file  . Stress: Not on file  Relationships  . Social connections:    Talks on phone: Not on  file    Gets together: Not on file    Attends religious service: Not on file    Active member of club or organization: Not on file    Attends meetings of clubs or organizations: Not on file    Relationship status: Not on file  Other Topics Concern  . Not on file  Social History Narrative   Graduated from [employer].  Applying to grad school for school counseling (Masters in Education).  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   Allergies  Allergen Reactions  . Other     Seasonal allergies   Family History  Problem Relation Age of Onset  . Hypertension Father   . Hypothyroidism Mother   . Hyperlipidemia Mother        borderline; good HDL, borderline LDL  . Allergies Mother   . Lupus Maternal Grandmother   . Heart disease Maternal Grandfather        CHF  . COPD Maternal Grandfather   . Asthma Maternal Grandfather   . Cancer Maternal Grandfather        thymic  . Stroke Paternal Grandfather [id number]  . Diabetes Paternal  Grandfather     Current Outpatient Medications (Endocrine & Metabolic):  .  medroxyPROGESTERone (DEPO-PROVERA) 150 MG/ML injection, Inject 1 mL (150 mg total) into the muscle every 3 (three) months. .  predniSONE (STERAPRED UNI-PAK 21 TAB) 10 MG (21) TBPK tablet, Take by mouth daily. Tapering dose as manufacturer recommends      Current Outpatient Medications (Other):  .  calcium carbonate (OS-CAL) 600 MG TABS, Take 600 mg by mouth 2 (two) times daily with a meal. .  cholecalciferol (VITAMIN D) 1000 UNITS tablet, Take 1,000 Units by mouth daily. Marland Kitchen  dexmethylphenidate (FOCALIN XR) 15 MG 24 hr capsule, Take 15 mg by mouth daily as needed (to help with focus issues).  .  gabapentin (NEURONTIN) 100 MG capsule, TAKE 2 CAPSULES (200 MG TOTAL) BY MOUTH AT BEDTIME. .  hydroxychloroquine (PLAQUENIL) 200 MG tablet, Take 400 mg by mouth daily.  Marland Kitchen  immune globulin, human,  (GAMMAGARD S/D) 5 G injection, Inject into the vein. .  Multiple Vitamins-Minerals (HAIR/SKIN/NAILS PO), Take 1 tablet by mouth daily. .  pantoprazole (PROTONIX) 40 MG tablet, Take 40 mg by mouth daily. Marland Kitchen  scopolamine (TRANSDERM-SCOP) 1 MG/3DAYS, Place 1 patch (1.5 mg total) onto the skin every 3 (three) days. Marland Kitchen  sertraline (ZOLOFT) 100 MG tablet, Take 1 tablet (100 mg total) by mouth daily. .  Vitamin D, Ergocalciferol, (DRISDOL) 50000 units CAPS capsule, Take 1 capsule (50,000 Units total) by mouth every 7 (seven) days.    Past medical history, social, surgical and family history all reviewed in electronic medical record.  No pertanent information unless stated regarding to the chief complaint.   Review of Systems:  No headache, visual changes, nausea, vomiting, diarrhea, constipation, dizziness, abdominal pain, skin rash, fevers, chills, night sweats, weight loss, swollen lymph nodes, body aches, joint swelling, muscle aches, chest pain, shortness of breath, mood changes.   Objective  There were no vitals taken for this visit. Systems examined below as of    General: No apparent distress alert and oriented x3 mood and affect normal, dressed appropriately.  HEENT: Pupils equal, extraocular movements intact  Respiratory: Patient's speak in full sentences and does not appear short of breath  Cardiovascular: No lower extremity edema, non tender, no erythema  Skin: Warm dry intact with no signs of infection or rash on extremities or on axial skeleton.  Abdomen: Soft nontender  Neuro: Cranial nerves II through XII are intact, neurovascularly intact in all extremities with 2+ DTRs and 2+ pulses.  Lymph: No lymphadenopathy of posterior or anterior cervical chain or axillae bilaterally.  Gait normal with good balance and coordination.  MSK:  Non tender with full range of motion and good stability and symmetric strength and tone of shoulders, elbows, wrist, hip, knee and ankles bilaterally.       Impression and Recommendations:     This case required medical decision making of moderate complexity. The above documentation has been reviewed and is accurate and complete Judi Saa, DO       Note: This dictation was prepared with Dragon dictation along with smaller phrase technology. Any transcriptional errors that result from this process are unintentional.

## 2018-04-06 ENCOUNTER — Ambulatory Visit: Payer: 59 | Admitting: Family Medicine

## 2018-04-25 ENCOUNTER — Other Ambulatory Visit: Payer: Self-pay | Admitting: Family Medicine

## 2018-06-29 ENCOUNTER — Ambulatory Visit (INDEPENDENT_AMBULATORY_CARE_PROVIDER_SITE_OTHER): Payer: 59 | Admitting: Family Medicine

## 2018-06-29 ENCOUNTER — Encounter: Payer: Self-pay | Admitting: Family Medicine

## 2018-06-29 VITALS — BP 102/60 | HR 79 | Ht 70.0 in | Wt 155.0 lb

## 2018-06-29 DIAGNOSIS — R42 Dizziness and giddiness: Secondary | ICD-10-CM

## 2018-06-29 DIAGNOSIS — R55 Syncope and collapse: Secondary | ICD-10-CM | POA: Diagnosis not present

## 2018-06-29 DIAGNOSIS — M999 Biomechanical lesion, unspecified: Secondary | ICD-10-CM | POA: Diagnosis not present

## 2018-06-29 DIAGNOSIS — M357 Hypermobility syndrome: Secondary | ICD-10-CM | POA: Diagnosis not present

## 2018-06-29 MED ORDER — MONTELUKAST SODIUM 10 MG PO TABS: 10.0000 mg | ORAL_TABLET | Freq: Every day | ORAL | 3 refills | Status: DC

## 2018-06-29 NOTE — Patient Instructions (Signed)
Good to see you  Ice is your friend when needed Don't avoid salt but don't add Cardiology will call you  Singulair daily and see how you feel  A little less with PT  See me again in 2 months

## 2018-06-29 NOTE — Progress Notes (Signed)
Tawana ScaleZach Aunesty Tyson D.O. [facility] [address] [place], [place] [address] Phone: [phone] Subjective:    I Michele NighKana Thompson am serving as a Neurosurgeonscribe for Dr. Antoine PrimasZachary Camdan Burdi.   I'm seeing this patient by the request  of:    CC:   BJY:[id number]:Subjective     Updated 06/29/2018  Michele Burke is a [age] y.o. female coming in with complaint of polyarthralgia. States that she is doing better. Has a few concerns.  Patient has been seen previously.  Does have polyarthralgia has been improving with some of the vitamin D supplementation and over-the-counter medications.  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  Patient is concerned though because she continues to work with a physical therapist and continues to have some discomfort and pain in the lower back as well as somewhat of the neck.  Patient was wondering if she had more of a contact connective tissue disorder.  Also potentially she is concerned more recently that when she works out she does get lightheaded very regularly.  Sometimes feels like her heart is finding things.  Only seems to be with this or when she changes positions.  Patient has never passed out though from working out but has come close.     Past Medical History:  Diagnosis Date  . Depression 2008   recurred in HS  . Dysmenorrhea 07/11/2010  . History of menorrhagia 07/11/2010  . Irritable bowel syndrome   . 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 Migraines middle school  . 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 COLONOSCOPY  2014   Dr. Jerral RalphSchooler--normal  . KNEE SURGERY Right    plica removal, arthroscopic  . TONSILLECTOMY  2000  . ULNA OSTEOTOMY Left 09/2014   Dr. Orlan Leavensrtman; ulna shortening  . WISDOM TOOTH EXTRACTION  2011   Social History   Socioeconomic History  . Marital status: Single    Spouse name: Not on file  . Number of children: Not on file  . Years of  education: Not on file  . Highest education level: Not on file  Occupational History  . Occupation: Consulting civil engineerstudent  Social Needs  . Financial resource strain: Not on file  . Food insecurity:    Worry: Not on file    Inability: Not on file  . Transportation needs:    Medical: Not on file    Non-medical: Not on file  Tobacco Use  . Smoking status: Never Smoker  . Smokeless tobacco: Never Used  Substance and Sexual Activity  . Alcohol use: No    Alcohol/week: 0.0 standard drinks    Comment: infrequent  . Drug use: No  . Sexual activity: Never    Birth control/protection: Other-see comments    Comment: CELIBATE  Lifestyle  . Physical activity:    Days per week: Not on file    Minutes per session: Not on file  . Stress: Not on file  Relationships  . Social connections:    Talks on phone: Not on file    Gets together: Not on file    Attends religious service: Not on file    Active member of club or organization: Not on file    Attends meetings of clubs or organizations: Not on file    Relationship status: Not on file  Other Topics Concern  . Not on file  Social History Narrative   Graduated from [employer].  Applying to grad school for school counseling (Masters in Education).  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   Allergies  Allergen Reactions  . Other     Seasonal allergies   Family History  Problem Relation Age of Onset  . Hypertension Father   . Hypothyroidism Mother   . Hyperlipidemia Mother        borderline; good HDL, borderline LDL  . Allergies Mother   . Lupus Maternal Grandmother   . Heart disease Maternal Grandfather        CHF  . COPD Maternal Grandfather   . Asthma Maternal Grandfather   . Cancer Maternal Grandfather        thymic  . Stroke Paternal Grandfather 52  . Diabetes Paternal Grandfather     Current Outpatient Medications (Endocrine &  Metabolic):  .  medroxyPROGESTERone (DEPO-PROVERA) 150 MG/ML injection, Inject 1 mL (150 mg total) into the muscle every 3 (three) months. .  predniSONE (STERAPRED UNI-PAK 21 TAB) 10 MG (21) TBPK tablet, Take by mouth daily. Tapering dose as manufacturer recommends   Current Outpatient Medications (Respiratory):  .  montelukast (SINGULAIR) 10 MG tablet, Take 1 tablet (10 mg total) by mouth at bedtime.    Current Outpatient Medications (Other):  .  calcium carbonate (OS-CAL) 600 MG TABS, Take 600 mg by mouth 2 (two) times daily with a meal. .  cholecalciferol (VITAMIN D) 1000 UNITS tablet, Take 1,000 Units by mouth daily. Marland Kitchen  dexmethylphenidate (FOCALIN XR) 15 MG 24 hr capsule, Take 15 mg by mouth daily as needed (to help with focus issues).  .  gabapentin (NEURONTIN) 100 MG capsule, TAKE 2 CAPSULES (200 MG TOTAL) BY MOUTH AT BEDTIME. .  hydroxychloroquine (PLAQUENIL) 200 MG tablet, Take 400 mg by mouth daily.  Marland Kitchen  immune globulin, human, (GAMMAGARD S/D) 5 G injection, Inject into the vein. .  Multiple Vitamins-Minerals (HAIR/SKIN/NAILS PO), Take 1 tablet by mouth daily. .  pantoprazole (PROTONIX) 40 MG tablet, Take 40 mg by mouth daily. Marland Kitchen  scopolamine (TRANSDERM-SCOP) 1 MG/3DAYS, Place 1 patch (1.5 mg total) onto the skin every 3 (three) days. Marland Kitchen  sertraline (ZOLOFT) 100 MG tablet, Take 1 tablet (100 mg total) by mouth daily. .  Vitamin D, Ergocalciferol, (DRISDOL) 1.25 MG (50000 UT) CAPS capsule, TAKE 1 CAPSULE (50,000 UNITS TOTAL) BY MOUTH EVERY 7 (SEVEN) DAYS.    Past medical history, social, surgical and family history all reviewed in electronic medical record.  No pertanent information unless stated regarding to the chief complaint.   Review of Systems:  No headache, visual changes, nausea, vomiting, diarrhea, constipation, dizziness, abdominal pain, skin rash, fevers, chills, night sweats, weight loss, swollen lymph nodes, body aches, joint swelling, , chest pain, shortness of breath,  mood changes.  Positive muscle aches  Objective  Blood pressure 102/60, pulse 79, height 5\' 10"  (1.778 m), weight 155 lb (70.3 kg), SpO2 98 %.   General: No apparent distress alert and oriented x3 mood and affect normal, dressed appropriately.  HEENT: Pupils equal, extraocular movements intact mild abnormal facies noted Respiratory: Patient's speak in full sentences and does not appear short of breath  Cardiovascular: No lower extremity edema, non tender, no erythema mild 2 out of 6 ejection murmur heard in the left lower sternal border. Skin: Warm dry intact with no signs of infection or rash on extremities or on axial skeleton.  Abdomen: Soft nontender  Neuro: Cranial nerves II through XII are intact, neurovascularly intact in all extremities with 2+ DTRs and 2+ pulses.  Lymph: No lymphadenopathy of posterior or anterior cervical chain or axillae bilaterally.  Gait normal with good balance and coordination.  MSK:  tender with full range of motion and good stability and symmetric strength and tone of shoulders, elbows, wrist, hip, knee and ankles bilaterally.  Hypermobility noted but beighten score of 6  Neck exam does show some hypermobility.  Patient does have some mild rotation and pain noted with certain range of motion exercises.  Lower back does have some tightness noted with some mild scoliosis.  Tightness to hamstrings noted bilaterally but no radicular symptoms.  Osteopathic findings C2 flexed rotated and side bent right C6 flexed rotated and side bent left T3 extended rotated and side bent right inhaled third rib T7 extended rotated and side bent left     Impression and Recommendations:     This case required medical decision making of moderate complexity. The above documentation has been reviewed and is accurate and complete Judi Saa, DO       Note: This dictation was prepared with Dragon dictation along with smaller phrase technology. Any transcriptional errors  that result from this process are unintentional.

## 2018-06-29 NOTE — Assessment & Plan Note (Signed)
Hypermobility noted.  Discussed with patient about the possible referral to connective tissue disorder.  I do not believe that this would likely change management significantly.  Encourage patient to continue to work out on a regular basis and core strengthening.  Patient will try this and follow-up with me again in 4 to 8 weeks before we do anything more concerning.  Discussed strength and set of range of motion exercises.

## 2018-06-29 NOTE — Assessment & Plan Note (Signed)
Difficult to assess.  Patient does have a history of an autoimmune disease.  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  Referring patient to cardiology today for further work-up.  Could be also secondary to some of her medications.  Discussed with her at great length and discussed with signs and symptoms and when to seek medical management.

## 2018-06-29 NOTE — Assessment & Plan Note (Addendum)
Decision today to treat with OMT was based on Physical Exam  After verbal consent patient was treated with HVLA, ME, FPR techniques in cervical, thoracic, rib areas  Patient tolerated the procedure well with improvement in symptoms  Patient given exercises, stretches and lifestyle modifications  See medications in patient instructions if given  Patient will follow up in 4-8 weeks 

## 2018-08-26 ENCOUNTER — Ambulatory Visit: Payer: 59 | Admitting: Family Medicine

## 2018-08-30 ENCOUNTER — Telehealth: Payer: Self-pay

## 2018-08-30 NOTE — Telephone Encounter (Signed)
Called pt to move her from 4/13 to 4/17 virtual visit  Pts mobile number VM box is full Unable to LM  Called pts home number listed and LMTCB

## 2018-08-31 NOTE — Telephone Encounter (Signed)
I spoke with the pt who is agreeable to video visit with Dr. Tenny Craw.  She is aware of her appt date and time.  She has consented to this visit.  TELEPHONE CALL NOTE  Michele Burke has been deemed a candidate for a follow-up tele-health visit to limit community exposure during the Covid-19 pandemic. I spoke with the patient via phone to ensure availability of phone/video source, confirm preferred email & phone number, and discuss instructions and expectations.  I reminded Michele Burke to be prepared with any vital sign and/or heart rhythm information that could potentially be obtained via home monitoring, at the time of her visit. I reminded Michele Burke to expect a phone call at the time of her visit if her visit.  Did the patient verbally acknowledge consent to treatment? YES  Lendon Ka, RN 08/31/2018 11:09 AM   DOWNLOADING THE WEBEX SOFTWARE TO SMARTPHONE  - If Apple, go to [employer] and type in WebEx in the search bar. Download [employer], the blue/green circle. The app is free but as with any other app downloads, their phone may require them to verify saved payment information or Apple password. The patient does NOT have to create an account.  - If Android, ask patient to go to [employer] and type in WebEx in the search bar. Download [employer], the blue/green circle. The app is free but as with any other app downloads, their phone may require them to verify saved payment information or Android password. The patient does NOT have to create an account.   CONSENT FOR TELE-HEALTH VISIT - PLEASE REVIEW  I hereby voluntarily request, consent and authorize [facility] and its employed or contracted physicians, physician assistants, nurse practitioners or other licensed health care professionals (the Practitioner), to provide me with telemedicine health care services (the "Services") as deemed necessary by the treating Practitioner. I  acknowledge and consent to receive the Services by the Practitioner via telemedicine. I understand that the telemedicine visit will involve communicating with the Practitioner through live audiovisual communication technology and the disclosure of certain medical information by electronic transmission. I acknowledge that I have been given the opportunity to request an in-person assessment or other available alternative prior to the telemedicine visit and am voluntarily participating in the telemedicine visit.  I understand that I have the right to withhold or withdraw my consent to the use of telemedicine in the course of my care at any time, without affecting my right to future care or treatment, and that the Practitioner or I may terminate the telemedicine visit at any time. I understand that I have the right to inspect all information obtained and/or recorded in the course of the telemedicine visit and may receive copies of available information for a reasonable fee.  I understand that some of the potential risks of receiving the Services via telemedicine include:  Marland Kitchen Delay or interruption in medical evaluation due to technological equipment failure or disruption; . Information transmitted may not be sufficient (e.g. poor resolution of images) to allow for appropriate medical decision making by the Practitioner; and/or  . In rare instances, security protocols could fail, causing a breach of personal health information.  Furthermore, I acknowledge that it is my responsibility to provide information about my medical history, conditions and care that is complete and accurate to the best of my ability. I acknowledge that Practitioner's advice, recommendations, and/or decision may be based on factors not within their  control, such as incomplete or inaccurate data provided by me or distortions of diagnostic images or specimens that may result from electronic transmissions. I understand that the practice of  medicine is not an exact science and that Practitioner makes no warranties or guarantees regarding treatment outcomes. I acknowledge that I will receive a copy of this consent concurrently upon execution via email to the email address I last provided but may also request a printed copy by calling the office of [facility].    I understand that my insurance will be billed for this visit.   I have read or had this consent read to me. . I understand the contents of this consent, which adequately explains the benefits and risks of the Services being provided via telemedicine.  . I have been provided ample opportunity to ask questions regarding this consent and the Services and have had my questions answered to my satisfaction. . I give my informed consent for the services to be provided through the use of telemedicine in my medical care  By participating in this telemedicine visit I agree to the above.

## 2018-09-05 ENCOUNTER — Telehealth (INDEPENDENT_AMBULATORY_CARE_PROVIDER_SITE_OTHER): Payer: 59 | Admitting: Internal Medicine

## 2018-09-05 ENCOUNTER — Encounter: Payer: Self-pay | Admitting: Internal Medicine

## 2018-09-05 ENCOUNTER — Other Ambulatory Visit: Payer: Self-pay

## 2018-09-05 ENCOUNTER — Ambulatory Visit: Payer: 59 | Admitting: Internal Medicine

## 2018-09-05 VITALS — BP 113/77 | HR 73 | Ht 70.0 in | Wt 153.0 lb

## 2018-09-05 DIAGNOSIS — R42 Dizziness and giddiness: Secondary | ICD-10-CM | POA: Diagnosis not present

## 2018-09-05 DIAGNOSIS — E063 Autoimmune thyroiditis: Secondary | ICD-10-CM

## 2018-09-05 MED ORDER — MAGNESIUM OXIDE 400 MG PO TABS: 400.0000 mg | ORAL_TABLET | Freq: Every day | ORAL | 3 refills | Status: AC

## 2018-09-05 NOTE — Progress Notes (Signed)
Virtual Visit via Video Note   This visit type was conducted due to national recommendations for restrictions regarding the COVID-19 Pandemic (e.g. social distancing) in an effort to limit this patient's exposure and mitigate transmission in our community.  Due to her co-morbid illnesses, this patient is at least at moderate risk for complications without adequate follow up.  This format is felt to be most appropriate for this patient at this time.  All issues noted in this document were discussed and addressed.  A limited physical exam was performed with this format.  Please refer to the patient's chart for her consent to telehealth for [facility].   Evaluation Performed:  Follow-up visit  Date:  09/05/2018   ID:  Michele Burke, DOB April 19, 1991, MRN [id number]  Patient Location: Home  Provider Location: Home  PCP:  Patient, No Pcp Per  Cardiologist: New  Chief Complaint: Pt is referred by Michiel Sites for dizziness  History of Present Illness:    Michele Burke is a 28 y.o. female who presents via audio/video conferencing for a telehealth visit today.    Pt has a history of thyroiditis   She is followed through [facility] She has had a long history of dizziness   Years  Feels like it has gotten worse over the past year .Denies syncope.   Has good and bad days   Feels better when cooler. Tries to exercise but has problems with aerobic work outs.    Feels bad  Eats:   Breakfast   PBJ or oatmeal   Lunch  Salad or burrito bowl   Dinner:  Chicken and veg Doesnot add salt much Drinks bout 20 ounces of lfuid per day   (2 cups caffinated coffee; couple decaf, diet coke) Tends to be cold Recently has constipation   Treating with Miralax Had gastric emptying study in [place] which was reported normal   Does complain of HA   Begin at back of head/neck   Then frontal    The patient does not have symptoms concerning for COVID-19 infection (fever, chills, cough, or new shortness of breath).    Past Medical History:  Diagnosis Date  . Depression 2008   recurred in HS  . Dysmenorrhea 07/11/2010  . History of menorrhagia 07/11/2010  . Irritable bowel syndrome   . 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 Migraines middle school  . 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 COLONOSCOPY  2014   Dr. Jerral Ralph  . KNEE SURGERY Right    plica removal, arthroscopic  . TONSILLECTOMY  2000  . ULNA OSTEOTOMY Left 09/2014   Dr. Orlan Leavens; ulna shortening  . WISDOM TOOTH EXTRACTION  2011     Current Meds  Medication Sig  . calcium carbonate (OS-CAL) 600 MG TABS Take 600 mg by mouth 2 (two) times daily with a meal.  . cholecalciferol (VITAMIN D) 1000 UNITS tablet Take 1,000 Units by mouth daily.  Marland Kitchen Fexofenadine HCl (ALLEGRA PO) Take 1 tablet by mouth daily.  Marland Kitchen gabapentin (NEURONTIN) 100 MG capsule TAKE 2 CAPSULES (200 MG TOTAL) BY MOUTH AT BEDTIME.  . hydroxychloroquine (PLAQUENIL) 200 MG tablet Take 400 mg by mouth daily.   Marland Kitchen immune globulin, human, (GAMMAGARD S/D) 5 G injection Inject into the vein.  . medroxyPROGESTERone (DEPO-PROVERA) 150 MG/ML injection Inject 1 mL (150 mg total) into the muscle every 3 (three) months.  . montelukast (SINGULAIR) 10 MG  tablet Take 1 tablet (10 mg total) by mouth at bedtime.  . Multiple Vitamins-Minerals (HAIR/SKIN/NAILS PO) Take 1 tablet by mouth daily.  Marland Kitchen scopolamine (TRANSDERM-SCOP) 1 MG/3DAYS Place 1 patch (1.5 mg total) onto the skin every 3 (three) days.  Marland Kitchen sertraline (ZOLOFT) 100 MG tablet Take 150 mg by mouth daily.     Allergies:   Other   Social History   Tobacco Use  . Smoking status: Never Smoker  . Smokeless tobacco: Never Used  Substance Use Topics  . Alcohol use: No    Alcohol/week: 0.0 standard drinks    Comment: infrequent  . Drug use: No     Family Hx: The patient's family history includes Allergies in her mother; Asthma in her maternal  grandfather; COPD in her maternal grandfather; Cancer in her maternal grandfather; Diabetes in her paternal grandfather; Heart disease in her maternal grandfather; Hyperlipidemia in her mother; Hypertension in her father; Hypothyroidism in her mother; Lupus in her maternal grandmother; Stroke (age of onset: 21) in her paternal grandfather.  ROS:   Please see the history of present illness.     All other systems reviewed and are negative.   Prior CV studies:   The following studies were reviewed today: None  Labs/Other Tests and Data Reviewed:    EKG:    Recent Labs: 02/03/2018: ALT 20; BUN 12; Creatinine, Ser 0.91; Hemoglobin 14.3; Platelets 201.0; Potassium 3.9; Sodium 136; TSH 1.48   Recent Lipid Panel No results found for: CHOL, TRIG, HDL, CHOLHDL, LDLCALC, LDLDIRECT  Wt Readings from Last 3 Encounters:  06/29/18 155 lb (70.3 kg)  03/07/18 155 lb (70.3 kg)  02/03/18 151 lb (68.5 kg)     Objective:    Vital Signs:  There were no vitals taken for this visit.   Well nourished, well developed female in no  acute distress.   ASSESSMENT & PLAN:    1. Dizziness   Pt's history of suggestive of autonomic dysfunciton    I am not sure if related to thyroiditis.       I would recomm that she incresae salt and noncaffeinated fluid intake Salt 6-7 g per day    Try to increase resistance training in legs       Walk   As well Frequent small meals Elevate head of bed SPANX for abdominal compression. Complains of headaches, start posterior   Will Rx MgOxide  I asked her to check BP/ P   Laying sitting and standing and send Otherwise call in a few weeks with how she feels  2   Thyroiditis   Followed at [facility] Keep on meds  3  COVID INFECTION Encouraged pt to lay low    Not get sick   She will be sensitive from BP standpont     COVID-19 Education: The signs and symptoms of COVID-19 were discussed with the patient and how to seek care for testing (follow up with PCP or arrange  E-visit). The importance of social distancing was discussed today.  Time:   Today, I have spent 25 minutes with the patient with telehealth technology discussing the above problems.     Medication Adjustments/Labs and Tests Ordered: Current medicines are reviewed at length with the patient today.  Concerns regarding medicines are outlined above.  Tests Ordered: No orders of the defined types were placed in this encounter.   Medication Changes: No orders of the defined types were placed in this encounter.   Disposition:  Follow up:   Pt will  write in with orthostatic BP and P    She will also write or call in in a few wks with how she feels after she makes chnages Signed, Dietrich PatesPaula Sequoia Witz, MD  09/05/2018 1:34 PM    [facility]

## 2018-09-05 NOTE — Patient Instructions (Addendum)
Medication Instructions:  Magnesium oxide 400 mg tablets - take one tablet by mouth daily  (Sent to [facility])  If you need a refill on your cardiac medications before your next appointment, please call your pharmacy.   Lab work: None  Testing/Procedures: none  Follow-Up: At [facility], you and your health needs are our priority.  As part of our continuing mission to provide you with exceptional heart care, we have created designated Provider Care Teams.  These Care Teams include your primary Cardiologist (physician) and Advanced Practice Providers (APPs -  Physician Assistants and Nurse Practitioners) who all work together to provide you with the care you need, when you need it.   Any Other Special Instructions Will Be Listed Below (If Applicable). Increase fluid and sodium intake.  Check blood pressures laying standing and sitting. Send update via MyChart or call office with how your symptoms are in a few weeks, including blood pressures.

## 2018-09-07 ENCOUNTER — Ambulatory Visit: Payer: Self-pay

## 2018-09-07 ENCOUNTER — Other Ambulatory Visit: Payer: Self-pay

## 2018-09-07 ENCOUNTER — Ambulatory Visit (INDEPENDENT_AMBULATORY_CARE_PROVIDER_SITE_OTHER): Payer: 59 | Admitting: Family Medicine

## 2018-09-07 ENCOUNTER — Encounter: Payer: Self-pay | Admitting: Family Medicine

## 2018-09-07 VITALS — BP 102/64 | Ht 70.0 in | Wt 153.0 lb

## 2018-09-07 DIAGNOSIS — M79642 Pain in left hand: Secondary | ICD-10-CM

## 2018-09-07 DIAGNOSIS — S6000XA Contusion of unspecified finger without damage to nail, initial encounter: Secondary | ICD-10-CM

## 2018-09-07 NOTE — Assessment & Plan Note (Signed)
Hand contusion, no cortical defect, and ultrasound showed this.  Do not feel x-rays are needed, discussed compression, over-the-counter topical anti-inflammatories, icing regimen.  Patient will increase activity slowly.  Worsening pain in 2 to 3 weeks patient needs to be seen again but I think patient will do very well.

## 2018-09-07 NOTE — Patient Instructions (Signed)
Good to see you  Ace wrap if you want for comfort Arnica lotion 2 times a day for the bruise  Ice 20 minutes 2 times daily. Usually after activity and before bed.  No upper body until next week and listen to your body  Send me a message in 1-2 weeks

## 2018-09-07 NOTE — Progress Notes (Signed)
Michele Burke D.O. [facility] [address] [place], [place] [address] Phone: [phone] Subjective:     Michele Burke, Michele Burke, am serving as a scribe for Dr. Antoine PrimasZachary Adreyan Carbajal.   CC: Hand pain  BJY:NWGNFAOZHYHPI:Subjective  Michele Burke is a [age] y.o. female coming in with complaint of left hand pain. Slammed her hand in the car door yesterday. Patient is having pain over the metacarpal heads. Patient is having dull ache. Is being woken up during the night in pain. Can make a full fist but feels like her pinky finger is getting stuck when extending it.        Past Medical History:  Diagnosis Date  . Depression 2008   recurred in HS  . Dysmenorrhea 07/11/2010  . History of menorrhagia 07/11/2010  . Irritable bowel syndrome   . 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 Migraines middle school  . 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 COLONOSCOPY  2014   Dr. Jerral RalphSchooler--normal  . KNEE SURGERY Right    plica removal, arthroscopic  . TONSILLECTOMY  2000  . ULNA OSTEOTOMY Left 09/2014   Dr. Orlan Leavensrtman; ulna shortening  . WISDOM TOOTH EXTRACTION  2011   Social History   Socioeconomic History  . Marital status: Single    Spouse name: Not on file  . Number of children: Not on file  . Years of education: Not on file  . Highest education level: Not on file  Occupational History  . Occupation: Consulting civil engineerstudent  Social Needs  . Financial resource strain: Not on file  . Food insecurity:    Worry: Not on file    Inability: Not on file  . Transportation needs:    Medical: Not on file    Non-medical: Not on file  Tobacco Use  . Smoking status: Never Smoker  . Smokeless tobacco: Never Used  Substance and Sexual Activity  . Alcohol use: No    Alcohol/week: 0.0 standard drinks    Comment: infrequent  . Drug use: No  . Sexual activity: Never    Birth control/protection: Other-see comments    Comment: CELIBATE  Lifestyle   . Physical activity:    Days per week: Not on file    Minutes per session: Not on file  . Stress: Not on file  Relationships  . Social connections:    Talks on phone: Not on file    Gets together: Not on file    Attends religious service: Not on file    Active member of club or organization: Not on file    Attends meetings of clubs or organizations: Not on file    Relationship status: Not on file  Other Topics Concern  . Not on file  Social History Narrative   Graduated from [employer].  Applying to grad school for school counseling (Masters in Education).  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   Allergies  Allergen Reactions  . Other     Seasonal allergies Seasonal allergies Plastic Tape   Family History  Problem Relation Age of Onset  . Hypertension Father   . Hypothyroidism Mother   . Hyperlipidemia Mother        borderline; good HDL, borderline LDL  . Allergies Mother   . Lupus Maternal Grandmother   . Heart disease Maternal  Grandfather        CHF  . COPD Maternal Grandfather   . Asthma Maternal Grandfather   . Cancer Maternal Grandfather        thymic  . Stroke Paternal Grandfather 45  . Diabetes Paternal Grandfather     Current Outpatient Medications (Endocrine & Metabolic):  .  medroxyPROGESTERone (DEPO-PROVERA) 150 MG/ML injection, Inject 1 mL (150 mg total) into the muscle every 3 (three) months.   Current Outpatient Medications (Respiratory):  Marland Kitchen  Fexofenadine HCl (ALLEGRA PO), Take 1 tablet by mouth daily. .  montelukast (SINGULAIR) 10 MG tablet, Take 1 tablet (10 mg total) by mouth at bedtime.    Current Outpatient Medications (Other):  .  calcium carbonate (OS-CAL) 600 MG TABS, Take 600 mg by mouth 2 (two) times daily with a meal. .  cholecalciferol (VITAMIN D) 1000 UNITS tablet, Take 1,000 Units by mouth daily. Marland Kitchen  gabapentin (NEURONTIN) 100 MG  capsule, TAKE 2 CAPSULES (200 MG TOTAL) BY MOUTH AT BEDTIME. .  hydroxychloroquine (PLAQUENIL) 200 MG tablet, Take 400 mg by mouth daily.  Marland Kitchen  immune globulin, human, (GAMMAGARD S/D) 5 G injection, Inject into the vein. .  magnesium oxide (MAG-OX) 400 MG tablet, Take 1 tablet (400 mg total) by mouth daily. .  Multiple Vitamins-Minerals (HAIR/SKIN/NAILS PO), Take 1 tablet by mouth daily. Marland Kitchen  scopolamine (TRANSDERM-SCOP) 1 MG/3DAYS, Place 1 patch (1.5 mg total) onto the skin every 3 (three) days. Marland Kitchen  sertraline (ZOLOFT) 100 MG tablet, Take 150 mg by mouth daily.    Past medical history, social, surgical and family history all reviewed in electronic medical record.  No pertanent information unless stated regarding to the chief complaint.   Review of Systems:  No headache, visual changes, nausea, vomiting, diarrhea, constipation, dizziness, abdominal pain, skin rash, fevers, chills, night sweats, weight loss, swollen lymph nodes, body aches, joint swelling,  chest pain, shortness of breath, mood changes.  Positive muscle aches  Objective  Blood pressure 102/64, height  (1.778 m), weight 153 lb (69.4 kg).    General: No apparent distress alert and oriented x3 mood and affect normal, dressed appropriately.  HEENT: Pupils equal, extraocular movements intact  Respiratory: Patient's speak in full sentences and does not appear short of breath  Cardiovascular: No lower extremity edema, non tender, no erythema  Skin: Warm dry intact with no signs of infection or rash on extremities or on axial skeleton.  Abdomen: Soft nontender  Neuro: Cranial nerves II through XII are intact, neurovascularly intact in all extremities with 2+ DTRs and 2+ pulses.  Lymph: No lymphadenopathy of posterior or anterior cervical chain or axillae bilaterally.  Gait mild antalgic gait MSK:  Non tender with full range of motion and good stability and symmetric strength and tone of shoulders, elbows, , hip, knee and ankles  bilaterally.  Hand exam on the left hand shows the patient does have some bruising on the dorsal aspect of the hand.  Mild amount of swelling.  Tender in this area.  No significant angulation noted of any of the fingers.  Patient with extension of the pinky does have a little bit of an extension or triggering it appears.  No pain over the palmar aspect of the finger though.  Most of the bruising seems to be on this second and third fingers But it musculoskeletal ultrasound was performed and interpreted by Judi Saa  Limited ultrasound shows the patient is doing very well with no significant cortical defect.  Some bone contusion  noted.   Impression and Recommendations:     This case required medical decision making of moderate complexity. The above documentation has been reviewed and is accurate and complete Judi Saa, DO       Note: This dictation was prepared with Dragon dictation along with smaller phrase technology. Any transcriptional errors that result from this process are unintentional.

## 2018-09-19 NOTE — Progress Notes (Deleted)
Tawana ScaleZach Kirby Argueta D.O. [facility] [address] [place], [place] [address] Phone: [phone] Subjective:    I'm seeing this patient by the request  of:    CC:   BJY:NWGNFAOZHYHPI:Subjective  Michele Burke is a 28 y.o. female coming in with complaint of ***  Onset-  Location Duration-  Character- Aggravating factors- Reliving factors-  Therapies tried-  Severity-     Past Medical History:  Diagnosis Date  . Depression 2008   recurred in HS  . Dysmenorrhea 07/11/2010  . History of menorrhagia 07/11/2010  . Irritable bowel syndrome   . 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 Migraines middle school  . 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 COLONOSCOPY  2014   Dr. Jerral RalphSchooler--normal  . KNEE SURGERY Right    plica removal, arthroscopic  . TONSILLECTOMY  2000  . ULNA OSTEOTOMY Left 09/2014   Dr. Orlan Leavensrtman; ulna shortening  . WISDOM TOOTH EXTRACTION  2011   Social History   Socioeconomic History  . Marital status: Single    Spouse name: Not on file  . Number of children: Not on file  . Years of education: Not on file  . Highest education level: Not on file  Occupational History  . Occupation: Consulting civil engineerstudent  Social Needs  . Financial resource strain: Not on file  . Food insecurity:    Worry: Not on file    Inability: Not on file  . Transportation needs:    Medical: Not on file    Non-medical: Not on file  Tobacco Use  . Smoking status: Never Smoker  . Smokeless tobacco: Never Used  Substance and Sexual Activity  . Alcohol use: No    Alcohol/week: 0.0 standard drinks    Comment: infrequent  . Drug use: No  . Sexual activity: Never    Birth control/protection: Other-see comments    Comment: CELIBATE  Lifestyle  . Physical activity:    Days per week: Not on file    Minutes per session: Not on file  . Stress: Not on file  Relationships  . Social connections:    Talks on phone: Not on  file    Gets together: Not on file    Attends religious service: Not on file    Active member of club or organization: Not on file    Attends meetings of clubs or organizations: Not on file    Relationship status: Not on file  Other Topics Concern  . Not on file  Social History Narrative   Graduated from [employer].  Applying to grad school for school counseling (Masters in Education).  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   Allergies  Allergen Reactions  . Other     Seasonal allergies Seasonal allergies Plastic Tape   Family History  Problem Relation Age of Onset  . Hypertension Father   . Hypothyroidism Mother   . Hyperlipidemia Mother        borderline; good HDL, borderline LDL  . Allergies Mother   . Lupus Maternal Grandmother   . Heart disease Maternal Grandfather        CHF  . COPD Maternal Grandfather   . Asthma Maternal Grandfather   . Cancer Maternal Grandfather        thymic  . Stroke Paternal Grandfather 261  .  Diabetes Paternal Grandfather     Current Outpatient Medications (Endocrine & Metabolic):  .  medroxyPROGESTERone (DEPO-PROVERA) 150 MG/ML injection, Inject 1 mL (150 mg total) into the muscle every 3 (three) months.   Current Outpatient Medications (Respiratory):  Marland Kitchen  Fexofenadine HCl (ALLEGRA PO), Take 1 tablet by mouth daily. .  montelukast (SINGULAIR) 10 MG tablet, Take 1 tablet (10 mg total) by mouth at bedtime.    Current Outpatient Medications (Other):  .  calcium carbonate (OS-CAL) 600 MG TABS, Take 600 mg by mouth 2 (two) times daily with a meal. .  cholecalciferol (VITAMIN D) 1000 UNITS tablet, Take 1,000 Units by mouth daily. Marland Kitchen  gabapentin (NEURONTIN) 100 MG capsule, TAKE 2 CAPSULES (200 MG TOTAL) BY MOUTH AT BEDTIME. .  hydroxychloroquine (PLAQUENIL) 200 MG tablet, Take 400 mg by mouth daily.  Marland Kitchen  immune globulin, human, (GAMMAGARD S/D)  5 G injection, Inject into the vein. .  magnesium oxide (MAG-OX) 400 MG tablet, Take 1 tablet (400 mg total) by mouth daily. .  Multiple Vitamins-Minerals (HAIR/SKIN/NAILS PO), Take 1 tablet by mouth daily. Marland Kitchen  scopolamine (TRANSDERM-SCOP) 1 MG/3DAYS, Place 1 patch (1.5 mg total) onto the skin every 3 (three) days. Marland Kitchen  sertraline (ZOLOFT) 100 MG tablet, Take 150 mg by mouth daily.    Past medical history, social, surgical and family history all reviewed in electronic medical record.  No pertanent information unless stated regarding to the chief complaint.   Review of Systems:  No headache, visual changes, nausea, vomiting, diarrhea, constipation, dizziness, abdominal pain, skin rash, fevers, chills, night sweats, weight loss, swollen lymph nodes, body aches, joint swelling, muscle aches, chest pain, shortness of breath, mood changes.   Objective  There were no vitals taken for this visit. Systems examined below as of    General: No apparent distress alert and oriented x3 mood and affect normal, dressed appropriately.  HEENT: Pupils equal, extraocular movements intact  Respiratory: Patient's speak in full sentences and does not appear short of breath  Cardiovascular: No lower extremity edema, non tender, no erythema  Skin: Warm dry intact with no signs of infection or rash on extremities or on axial skeleton.  Abdomen: Soft nontender  Neuro: Cranial nerves II through XII are intact, neurovascularly intact in all extremities with 2+ DTRs and 2+ pulses.  Lymph: No lymphadenopathy of posterior or anterior cervical chain or axillae bilaterally.  Gait normal with good balance and coordination.  MSK:  Non tender with full range of motion and good stability and symmetric strength and tone of shoulders, elbows, wrist, hip, knee and ankles bilaterally.     Impression and Recommendations:     This case required medical decision making of moderate complexity. The above documentation has been  reviewed and is accurate and complete Judi Saa, DO       Note: This dictation was prepared with Dragon dictation along with smaller phrase technology. Any transcriptional errors that result from this process are unintentional.

## 2018-09-20 ENCOUNTER — Ambulatory Visit: Payer: 59 | Admitting: Family Medicine

## 2018-09-22 ENCOUNTER — Encounter

## 2018-10-05 ENCOUNTER — Other Ambulatory Visit: Payer: Self-pay

## 2018-10-05 ENCOUNTER — Encounter: Payer: Self-pay | Admitting: Family Medicine

## 2018-10-05 ENCOUNTER — Ambulatory Visit (INDEPENDENT_AMBULATORY_CARE_PROVIDER_SITE_OTHER): Payer: 59 | Admitting: Family Medicine

## 2018-10-05 VITALS — BP 124/82 | HR 80 | Ht 70.0 in | Wt 157.0 lb

## 2018-10-05 DIAGNOSIS — M357 Hypermobility syndrome: Secondary | ICD-10-CM

## 2018-10-05 DIAGNOSIS — G2589 Other specified extrapyramidal and movement disorders: Secondary | ICD-10-CM | POA: Diagnosis not present

## 2018-10-05 DIAGNOSIS — M999 Biomechanical lesion, unspecified: Secondary | ICD-10-CM

## 2018-10-05 MED ORDER — AMOXICILLIN-POT CLAVULANATE 875-125 MG PO TABS: 1.0000 | ORAL_TABLET | Freq: Two times a day (BID) | ORAL | 0 refills | Status: AC

## 2018-10-05 NOTE — Progress Notes (Signed)
Tawana ScaleZach Alpha Chouinard D.O.  Sports Medicine [address] [place], [place] [address] Phone: [phone] Subjective:   I Ronelle NighKana Thompson am serving as a Neurosurgeonscribe for Dr. Antoine PrimasZachary Nasif Bos.  I'm seeing this patient by the request  of:    CC: Back and neck pain follow-up  BJY:[id number]:Subjective  Michele Burke is a 28 y.o. female coming in with complaint of back and neck pain. Neck is painful today. Everything else is doing well.  Has been doing more schoolwork at a computer.  Sitting on a more regular basis.  Feels like that is increasing the pain.  Has known scapular dyskinesis.  Has responded well to manipulation previously.  Also not doing the exercises regularly.     Past Medical History:  Diagnosis Date  . Depression 2008   recurred in HS  . Dysmenorrhea 07/11/2010  . History of menorrhagia 07/11/2010  . Irritable bowel syndrome   . 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 Migraines middle school  . 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 COLONOSCOPY  2014   Dr. Jerral RalphSchooler--normal  . KNEE SURGERY Right    plica removal, arthroscopic  . TONSILLECTOMY  2000  . ULNA OSTEOTOMY Left 09/2014   Dr. Orlan Leavensrtman; ulna shortening  . WISDOM TOOTH EXTRACTION  2011   Social History   Socioeconomic History  . Marital status: Single    Spouse name: Not on file  . Number of children: Not on file  . Years of education: Not on file  . Highest education level: Not on file  Occupational History  . Occupation: Consulting civil engineerstudent  Social Needs  . Financial resource strain: Not on file  . Food insecurity:    Worry: Not on file    Inability: Not on file  . Transportation needs:    Medical: Not on file    Non-medical: Not on file  Tobacco Use  . Smoking status: Never Smoker  . Smokeless tobacco: Never Used  Substance and Sexual Activity  . Alcohol use: No    Alcohol/week: 0.0 standard drinks    Comment: infrequent  . Drug use: No  .  Sexual activity: Never    Birth control/protection: Other-see comments    Comment: CELIBATE  Lifestyle  . Physical activity:    Days per week: Not on file    Minutes per session: Not on file  . Stress: Not on file  Relationships  . Social connections:    Talks on phone: Not on file    Gets together: Not on file    Attends religious service: Not on file    Active member of club or organization: Not on file    Attends meetings of clubs or organizations: Not on file    Relationship status: Not on file  Other Topics Concern  . Not on file  Social History Narrative   Graduated from [employer].  Applying to grad school for school counseling (Masters in Education).  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   Allergies  Allergen Reactions  . Other     Seasonal allergies Seasonal allergies Plastic Tape   Family History  Problem Relation Age of Onset  . Hypertension Father   . Hypothyroidism Mother   . Hyperlipidemia Mother        borderline; good HDL, borderline LDL  .  Allergies Mother   . Lupus Maternal Grandmother   . Heart disease Maternal Grandfather        CHF  . COPD Maternal Grandfather   . Asthma Maternal Grandfather   . Cancer Maternal Grandfather        thymic  . Stroke Paternal Grandfather 101  . Diabetes Paternal Grandfather     Current Outpatient Medications (Endocrine & Metabolic):  .  medroxyPROGESTERone (DEPO-PROVERA) 150 MG/ML injection, Inject 1 mL (150 mg total) into the muscle every 3 (three) months.   Current Outpatient Medications (Respiratory):  Marland Kitchen  Fexofenadine HCl (ALLEGRA PO), Take 1 tablet by mouth daily. .  montelukast (SINGULAIR) 10 MG tablet, Take 1 tablet (10 mg total) by mouth at bedtime.    Current Outpatient Medications (Other):  .  calcium carbonate (OS-CAL) 600 MG TABS, Take 600 mg by mouth 2 (two) times daily with a meal. .   cholecalciferol (VITAMIN D) 1000 UNITS tablet, Take 1,000 Units by mouth daily. Marland Kitchen  gabapentin (NEURONTIN) 100 MG capsule, TAKE 2 CAPSULES (200 MG TOTAL) BY MOUTH AT BEDTIME. .  hydroxychloroquine (PLAQUENIL) 200 MG tablet, Take 400 mg by mouth daily.  Marland Kitchen  immune globulin, human, (GAMMAGARD S/D) 5 G injection, Inject into the vein. .  magnesium oxide (MAG-OX) 400 MG tablet, Take 1 tablet (400 mg total) by mouth daily. .  Multiple Vitamins-Minerals (HAIR/SKIN/NAILS PO), Take 1 tablet by mouth daily. Marland Kitchen  scopolamine (TRANSDERM-SCOP) 1 MG/3DAYS, Place 1 patch (1.5 mg total) onto the skin every 3 (three) days. Marland Kitchen  sertraline (ZOLOFT) 100 MG tablet, Take 150 mg by mouth daily. Marland Kitchen  amoxicillin-clavulanate (AUGMENTIN) 875-125 MG tablet, Take 1 tablet by mouth 2 (two) times daily for 10 days.    Past medical history, social, surgical and family history all reviewed in electronic medical record.  No pertanent information unless stated regarding to the chief complaint.   Review of Systems:  No headache, visual changes, nausea, vomiting, diarrhea, constipation, dizziness, abdominal pain, skin rash, fevers, chills, night sweats, weight loss, swollen lymph nodes, body aches, joint swelling, muscle aches, chest pain, shortness of breath, mood changes.   Objective  Blood pressure 124/82, pulse 80, height 5\' 10"  (1.778 m), weight 157 lb (71.2 kg), SpO2 98 %.    General: No apparent distress alert and oriented x3 mood and affect normal, dressed appropriately.  HEENT: Pupils equal, extraocular movements intact  Respiratory: Patient's speak in full sentences and does not appear short of breath  Cardiovascular: No lower extremity edema, non tender, no erythema  Skin: Warm dry intact with no signs of infection or rash on extremities or on axial skeleton.  Abdomen: Soft nontender  Neuro: Cranial nerves II through XII are intact, neurovascularly intact in all extremities with 2+ DTRs and 2+ pulses.  Lymph: No  lymphadenopathy of posterior or anterior cervical chain or axillae bilaterally.  Gait normal with good balance and coordination.  MSK:  Non tender with full range of motion and good stability and symmetric strength and tone of elbows, wrist, hip, knee and ankles bilaterally.  Mild hypermobility noted Patient does have scapular dyskinesis noted bilaterally.  Significant tightness of the neck musculature.  Mild loss of lordosis.  Negative Spurling's.  Tightness of the paraspinal scapular region of the back. Hypermobility noted Osteopathic findings  C6 flexed rotated and side bent left T3 extended rotated and side bent right inhaled third rib T7 extended rotated and side bent left      Impression and Recommendations:  This case required medical decision making of moderate complexity. The above documentation has been reviewed and is accurate and complete Lyndal Pulley, DO       Note: This dictation was prepared with Dragon dictation along with smaller phrase technology. Any transcriptional errors that result from this process are unintentional.

## 2018-10-05 NOTE — Patient Instructions (Addendum)
Good to see you  New exercises for the hip Change seat on the bike and make it a little taller.  Stay active Augmentin 2 times a day for 10 days  See me again in 4-5 weeks

## 2018-10-05 NOTE — Assessment & Plan Note (Signed)
I believe more secondary to the scapular dyskinesis.  Encourage patient to work on Financial controller at home as well as the strengthening exercises on a more regular basis.  Discussed with patient about icing regimen and home exercises.  Patient responded well to manipulation.  Underlying autoimmune disease could be playing a role and we will continue to monitor.  Follow-up again in 4 to 6 weeks

## 2018-10-05 NOTE — Assessment & Plan Note (Signed)
Decision today to treat with OMT was based on Physical Exam  After verbal consent patient was treated with HVLA, ME, FPR techniques in cervical, rib, thoracic,  areas  Patient tolerated the procedure well with improvement in symptoms  Patient given exercises, stretches and lifestyle modifications  See medications in patient instructions if given  Patient will follow up in 4-6 weeks

## 2018-10-30 ENCOUNTER — Other Ambulatory Visit: Payer: Self-pay | Admitting: Family Medicine

## 2018-11-01 NOTE — Progress Notes (Signed)
Tawana ScaleZach Smith D.O. [facility] [address] [place], [place] [address] Phone: [phone] Subjective:   Bruce Donath, Valerie Wolf, am serving as a scribe for Dr. Antoine PrimasZachary Smith.   CC: Neck and back pain follow-up  WGN:[id number]:Subjective  Michele Burke is a 28 y.o. female coming in with complaint of neck pain. States that her neck has been bothering her more recently. Moving her arms overhead causes an increase in pain.      Past Medical History:  Diagnosis Date  . Depression 2008   recurred in HS  . Dysmenorrhea 07/11/2010  . History of menorrhagia 07/11/2010  . Irritable bowel syndrome   . 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 Migraines middle school  . 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 COLONOSCOPY  2014   Dr. Jerral RalphSchooler--normal  . KNEE SURGERY Right    plica removal, arthroscopic  . TONSILLECTOMY  2000  . ULNA OSTEOTOMY Left 09/2014   Dr. Orlan Leavensrtman; ulna shortening  . WISDOM TOOTH EXTRACTION  2011   Social History   Socioeconomic History  . Marital status: Single    Spouse name: Not on file  . Number of children: Not on file  . Years of education: Not on file  . Highest education level: Not on file  Occupational History  . Occupation: Consulting civil engineerstudent  Social Needs  . Financial resource strain: Not on file  . Food insecurity:    Worry: Not on file    Inability: Not on file  . Transportation needs:    Medical: Not on file    Non-medical: Not on file  Tobacco Use  . Smoking status: Never Smoker  . Smokeless tobacco: Never Used  Substance and Sexual Activity  . Alcohol use: No    Alcohol/week: 0.0 standard drinks    Comment: infrequent  . Drug use: No  . Sexual activity: Never    Birth control/protection: Other-see comments    Comment: CELIBATE  Lifestyle  . Physical activity:    Days per week: Not on file    Minutes per session: Not on file  . Stress: Not on file  Relationships  .  Social connections:    Talks on phone: Not on file    Gets together: Not on file    Attends religious service: Not on file    Active member of club or organization: Not on file    Attends meetings of clubs or organizations: Not on file    Relationship status: Not on file  Other Topics Concern  . Not on file  Social History Narrative   Graduated from [employer].  Applying to grad school for school counseling (Masters in Education).  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   Allergies  Allergen Reactions  . Other     Seasonal allergies Seasonal allergies Plastic Tape   Family History  Problem Relation Age of Onset  . Hypertension Father   . Hypothyroidism Mother   . Hyperlipidemia Mother        borderline; good HDL, borderline LDL  . Allergies Mother   . Lupus Maternal Grandmother   . Heart disease Maternal Grandfather        CHF  . COPD Maternal Grandfather   . Asthma Maternal Grandfather   . Cancer Maternal Grandfather  thymic  . Stroke Paternal Grandfather [phone]  . Diabetes Paternal Grandfather     Current Outpatient Medications (Endocrine & Metabolic):  .  medroxyPROGESTERone (DEPO-PROVERA) 150 MG/ML injection, Inject 1 mL (150 mg total) into the muscle every 3 (three) months.   Current Outpatient Medications (Respiratory):  Marland Kitchen.  Fexofenadine HCl (ALLEGRA PO), Take 1 tablet by mouth daily. .  montelukast (SINGULAIR) 10 MG tablet, TAKE 1 TABLET BY MOUTH EVERYDAY AT BEDTIME    Current Outpatient Medications (Other):  .  calcium carbonate (OS-CAL) 600 MG TABS, Take 600 mg by mouth 2 (two) times daily with a meal. .  cholecalciferol (VITAMIN D) 1000 UNITS tablet, Take 1,000 Units by mouth daily. Marland Kitchen.  gabapentin (NEURONTIN) 100 MG capsule, TAKE 2 CAPSULES (200 MG TOTAL) BY MOUTH AT BEDTIME. .  hydroxychloroquine (PLAQUENIL) 200 MG tablet, Take 400 mg by mouth daily.  Marland Kitchen.   immune globulin, human, (GAMMAGARD S/D) 5 G injection, Inject into the vein. .  magnesium oxide (MAG-OX) 400 MG tablet, Take 1 tablet (400 mg total) by mouth daily. .  Multiple Vitamins-Minerals (HAIR/SKIN/NAILS PO), Take 1 tablet by mouth daily. Marland Kitchen.  scopolamine (TRANSDERM-SCOP) 1 MG/3DAYS, Place 1 patch (1.5 mg total) onto the skin every 3 (three) days. Marland Kitchen.  sertraline (ZOLOFT) 100 MG tablet, Take 150 mg by mouth daily.    Past medical history, social, surgical and family history all reviewed in electronic medical record.  No pertanent information unless stated regarding to the chief complaint.   Review of Systems:  No headache, visual changes, nausea, vomiting, diarrhea, constipation, dizziness, abdominal pain, skin rash, fevers, chills, night sweats, weight loss, swollen lymph nodes, body aches, joint swelling, muscle aches, chest pain, shortness of breath, mood changes.   Objective  Blood pressure 110/70, pulse 76, height 5\' 10"  (1.778 m), weight 158 lb (71.7 kg), SpO2 98 %. Systems examined below as of    General: No apparent distress alert and oriented x3 mood and affect normal, dressed appropriately.  HEENT: Pupils equal, extraocular movements intact  Respiratory: Patient's speak in full sentences and does not appear short of breath  Cardiovascular: No lower extremity edema, non tender, no erythema  Skin: Warm dry intact with no signs of infection or rash on extremities or on axial skeleton.  Abdomen: Soft nontender  Neuro: Cranial nerves II through XII are intact, neurovascularly intact in all extremities with 2+ DTRs and 2+ pulses.  Lymph: No lymphadenopathy of posterior or anterior cervical chain or axillae bilaterally.  Gait antalgic  MSK:  Non tender with full range of motion and good stability and symmetric strength and tone of shoulders, elbows, wrist, hip, knee and ankles bilaterally.  Hypermobility Neck: Inspection loss of lordosis. No palpable stepoffs. Negative Spurling's  maneuver. Mild decrease in range of motion. Grip strength and sensation normal in bilateral hands Strength good C4 to T1 distribution No sensory change to C4 to T1 Negative Hoffman sign bilaterally Reflexes normal Tightness in the right trapezius  Back Exam:  Inspection: Unremarkable  Motion: Flexion 45 deg, Extension 25 deg, Side Bending to 35 deg bilaterally,  Rotation to 45 deg bilaterally  SLR laying: Negative  XSLR laying: Negative  Palpable tenderness: None. FABER: negative. Sensory change: Gross sensation intact to all lumbar and sacral dermatomes.  Reflexes: 2+ at both patellar tendons, 2+ at achilles tendons, Babinski's downgoing.  Strength at foot  Plantar-flexion: 5/5 Dorsi-flexion: 5/5 Eversion: 5/5 Inversion: 5/5  Leg strength  Quad: 5/5 Hamstring: 5/5 Hip flexor: 5/5 Hip abductors: 5/5  Osteopathic findings  C4 flexed rotated and side bent left C6 flexed rotated and side bent left T3 extended rotated and side bent right inhaled third rib T9 extended rotated and side bent left L2 flexed rotated and side bent right Sacrum right on right    Impression and Recommendations:     This case required medical decision making of moderate complexity. The above documentation has been reviewed and is accurate and complete Lyndal Pulley, DO       Note: This dictation was prepared with Dragon dictation along with smaller phrase technology. Any transcriptional errors that result from this process are unintentional.

## 2018-11-02 ENCOUNTER — Other Ambulatory Visit: Payer: Self-pay

## 2018-11-02 ENCOUNTER — Encounter: Payer: Self-pay | Admitting: Family Medicine

## 2018-11-02 ENCOUNTER — Ambulatory Visit (INDEPENDENT_AMBULATORY_CARE_PROVIDER_SITE_OTHER): Payer: 59 | Admitting: Family Medicine

## 2018-11-02 VITALS — BP 110/70 | HR 76 | Ht 70.0 in | Wt 158.0 lb

## 2018-11-02 DIAGNOSIS — G2589 Other specified extrapyramidal and movement disorders: Secondary | ICD-10-CM | POA: Diagnosis not present

## 2018-11-02 DIAGNOSIS — M999 Biomechanical lesion, unspecified: Secondary | ICD-10-CM

## 2018-11-02 NOTE — Assessment & Plan Note (Signed)
Continues to work on patient's posture and.  Discussed with patient having hypermobility and underlying autoimmune disease likely contributes.  We discussed icing regimen.  Attempted osteopathic manipulation again.  Hopefully patient will continue to do relatively well.  Follow-up again in 4 to 6 weeks

## 2018-11-02 NOTE — Assessment & Plan Note (Signed)
Decision today to treat with OMT was based on Physical Exam  After verbal consent patient was treated with HVLA, ME, FPR techniques in cervical, thoracic, rib lumbar and sacral areas  Patient tolerated the procedure well with improvement in symptoms  Patient given exercises, stretches and lifestyle modifications  See medications in patient instructions if given  Patient will follow up in 4-8 weeks 

## 2018-11-02 NOTE — Patient Instructions (Addendum)
DHEA 50 mg daily  Consider mouthgard at night after stopping the retainer.  See me again in 6 weeks

## 2018-12-02 ENCOUNTER — Ambulatory Visit: Payer: Self-pay

## 2018-12-02 ENCOUNTER — Encounter: Payer: Self-pay | Admitting: Family Medicine

## 2018-12-02 ENCOUNTER — Ambulatory Visit (INDEPENDENT_AMBULATORY_CARE_PROVIDER_SITE_OTHER): Payer: 59 | Admitting: Family Medicine

## 2018-12-02 VITALS — BP 104/66 | HR 72 | Ht 70.0 in | Wt 158.0 lb

## 2018-12-02 DIAGNOSIS — M79642 Pain in left hand: Secondary | ICD-10-CM | POA: Diagnosis not present

## 2018-12-02 DIAGNOSIS — S63622A Sprain of interphalangeal joint of left thumb, initial encounter: Secondary | ICD-10-CM

## 2018-12-02 DIAGNOSIS — S63602A Unspecified sprain of left thumb, initial encounter: Secondary | ICD-10-CM | POA: Insufficient documentation

## 2018-12-02 NOTE — Patient Instructions (Signed)
DHEA 50 mg daily for one month Frog splint at day and night for one week then nightly for 2 weeks See me when you need me

## 2018-12-02 NOTE — Assessment & Plan Note (Signed)
Sprain of the interphalangeal joint.  Very minimal hypoechoic changes about the joint.  No significant avulsion noted.  Mild increase in Doppler flow.  Patient will brace for 2 weeks, icing regimen, history of autoimmune disease will need to monitor for any erosive changes.  Follow-up again in 4 weeks if not improved.

## 2018-12-02 NOTE — Progress Notes (Signed)
[facility] [address] [place], [place] [address] Phone: [phone] Subjective:     CC: Thumb pain  [id number]  Michele Burke is a 27 y.o. female coming in with complaint of left hand. Was doing plank walk out when she caught her thumb. Felt a pop. Has been using IBU and ice. Pain over Anton joint.  Patient states that it is an aching sensation.  Patient does have an past medical history significant for hypermobility and has noticed it feels about the same and with range of motion.  No pain unless patient extends it.  Possibly had some swelling initially.      Past Medical History:  Diagnosis Date  . Depression 2008   recurred in HS  . Dysmenorrhea 07/11/2010  . History of menorrhagia 07/11/2010  . Irritable bowel syndrome   . Juvenile dermatomyositis ([place]) 2006  . Migraines middle school  . 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 COLONOSCOPY  2014   Dr. Lelan Pons  . KNEE SURGERY Right    plica removal, arthroscopic  . TONSILLECTOMY  2000  . ULNA OSTEOTOMY Left 09/2014   Dr. Apolonio Schneiders; ulna shortening  . WISDOM TOOTH EXTRACTION  2011   Social History   Socioeconomic History  . Marital status: Single    Spouse name: Not on file  . Number of children: Not on file  . Years of education: Not on file  . Highest education level: Not on file  Occupational History  . Occupation: Ship broker  Social Needs  . Financial resource strain: Not on file  . Food insecurity    Worry: Not on file    Inability: Not on file  . Transportation needs    Medical: Not on file    Non-medical: Not on file  Tobacco Use  . Smoking status: Never Smoker  . Smokeless tobacco: Never Used  Substance and Sexual Activity  . Alcohol use: No    Alcohol/week: 0.0 standard drinks    Comment: infrequent  . Drug use: No  . Sexual activity: Never    Birth control/protection:  Other-see comments    Comment: CELIBATE  Lifestyle  . Physical activity    Days per week: Not on file    Minutes per session: Not on file  . Stress: Not on file  Relationships  . Social Herbalist on phone: Not on file    Gets together: Not on file    Attends religious service: Not on file    Active member of club or organization: Not on file    Attends meetings of clubs or organizations: Not on file    Relationship status: Not on file  Other Topics Concern  . Not on file  Social History Narrative   Graduated from [employer].  Applying to grad school for school counseling (Masters in Education).  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   Allergies  Allergen Reactions  . Other     Seasonal allergies Seasonal allergies Plastic Tape   Family History  Problem Relation Age of Onset  . Hypertension Father   . Hypothyroidism Mother   . Hyperlipidemia Mother        borderline; good HDL, borderline LDL  . Allergies Mother   . Lupus Maternal Grandmother   .  Heart disease Maternal Grandfather        CHF  . COPD Maternal Grandfather   . Asthma Maternal Grandfather   . Cancer Maternal Grandfather        thymic  . Stroke Paternal Grandfather [id number]  . Diabetes Paternal Grandfather     Current Outpatient Medications (Endocrine & Metabolic):  .  medroxyPROGESTERone (DEPO-PROVERA) 150 MG/ML injection, Inject 1 mL (150 mg total) into the muscle every 3 (three) months.   Current Outpatient Medications (Respiratory):  Marland Kitchen.  Fexofenadine HCl (ALLEGRA PO), Take 1 tablet by mouth daily. .  montelukast (SINGULAIR) 10 MG tablet, TAKE 1 TABLET BY MOUTH EVERYDAY AT BEDTIME    Current Outpatient Medications (Other):  .  calcium carbonate (OS-CAL) 600 MG TABS, Take 600 mg by mouth 2 (two) times daily with a meal. .  cholecalciferol (VITAMIN D) 1000 UNITS tablet, Take 1,000 Units by  mouth daily. Marland Kitchen.  gabapentin (NEURONTIN) 100 MG capsule, TAKE 2 CAPSULES (200 MG TOTAL) BY MOUTH AT BEDTIME. .  hydroxychloroquine (PLAQUENIL) 200 MG tablet, Take 400 mg by mouth daily.  Marland Kitchen.  immune globulin, human, (GAMMAGARD S/D) 5 G injection, Inject into the vein. .  magnesium oxide (MAG-OX) 400 MG tablet, Take 1 tablet (400 mg total) by mouth daily. .  Multiple Vitamins-Minerals (HAIR/SKIN/NAILS PO), Take 1 tablet by mouth daily. Marland Kitchen.  scopolamine (TRANSDERM-SCOP) 1 MG/3DAYS, Place 1 patch (1.5 mg total) onto the skin every 3 (three) days. Marland Kitchen.  sertraline (ZOLOFT) 100 MG tablet, Take 150 mg by mouth daily.    Past medical history, social, surgical and family history all reviewed in electronic medical record.  No pertanent information unless stated regarding to the chief complaint.   Review of Systems:  No headache, visual changes, nausea, vomiting, diarrhea, constipation, dizziness, abdominal pain, skin rash, fevers, chills, night sweats, weight loss, swollen lymph nodes, body aches, joint swelling, , chest pain, shortness of breath, mood changes.  Positive muscle aches  Objective  Blood pressure 104/66, pulse 72, height 5\' 10"  (1.778 m), weight 158 lb (71.7 kg), SpO2 99 %.   General: No apparent distress alert and oriented x3 mood and affect normal, dressed appropriately.  HEENT: Pupils equal, extraocular movements intact  Respiratory: Patient's speak in full sentences and does not appear short of breath  Cardiovascular: No lower extremity edema, non tender, no erythema  Skin: Warm dry intact with no signs of infection or rash on extremities or on axial skeleton.  Abdomen: Soft nontender  Neuro: Cranial nerves II through XII are intact, neurovascularly intact in all extremities with 2+ DTRs and 2+ pulses.  Lymph: No lymphadenopathy of posterior or anterior cervical chain or axillae bilaterally.  Gait normal with good balance and coordination.  MSK:  Non tender with full range of motion and  good stability and symmetric strength and tone of shoulders, elbows, wrist, hip, knee and ankles bilaterally.  Hypermobility noted.  Left hand exam shows the patient does have some very trace amount of swelling of the [facility] compared to the contralateral side.  Pain was more in the interphalangeal area.  UCL is intact.  Neurovascular intact distally.  Limited musculoskeletal ultrasound was performed and interpreted by Judi SaaZachary M Rubye Strohmeyer  Limited ultrasound shows the patient does have trace effusion of the interphalangeal joint, no true avulsion noted.  Mild increased Doppler flow over the bone noted. Impression: Thumb sprain   Impression and Recommendations:     This case required medical decision making of moderate complexity. The above documentation has been  reviewed and is accurate and complete Lyndal Pulley, DO       Note: This dictation was prepared with Dragon dictation along with smaller phrase technology. Any transcriptional errors that result from this process are unintentional.

## 2018-12-07 ENCOUNTER — Encounter: Payer: Self-pay | Admitting: Family Medicine

## 2018-12-14 ENCOUNTER — Ambulatory Visit: Payer: 59 | Admitting: Family Medicine

## 2018-12-22 ENCOUNTER — Encounter: Payer: Self-pay | Admitting: Family Medicine

## 2019-01-02 ENCOUNTER — Encounter: Payer: Self-pay | Admitting: Family Medicine

## 2019-01-04 ENCOUNTER — Other Ambulatory Visit: Payer: Self-pay

## 2019-01-04 ENCOUNTER — Other Ambulatory Visit (INDEPENDENT_AMBULATORY_CARE_PROVIDER_SITE_OTHER): Payer: 59

## 2019-01-04 ENCOUNTER — Ambulatory Visit (INDEPENDENT_AMBULATORY_CARE_PROVIDER_SITE_OTHER)
Admission: RE | Admit: 2019-01-04 | Discharge: 2019-01-04 | Disposition: A | Discharge status: Home / Self Care | Payer: 59 | Source: Ambulatory Visit | Attending: Family Medicine | Admitting: Family Medicine

## 2019-01-04 DIAGNOSIS — M2550 Pain in unspecified joint: Secondary | ICD-10-CM

## 2019-01-04 LAB — COMPREHENSIVE METABOLIC PANEL
ALT: 20 U/L (ref 0–35)
AST: 21 U/L (ref 0–37)
Albumin: 4.9 g/dL (ref 3.5–5.2)
Alkaline Phosphatase: 52 U/L (ref 39–117)
BUN: 10 mg/dL (ref 6–23)
CO2: 25 mEq/L (ref 19–32)
Calcium: 9.9 mg/dL (ref 8.4–10.5)
Chloride: 104 mEq/L (ref 96–112)
Creatinine, Ser: 0.81 mg/dL (ref 0.40–1.20)
GFR: 85.64 mL/min (ref >60.00)
Glucose, Bld: 99 mg/dL (ref 70–99)
Potassium: 3.8 mEq/L (ref 3.5–5.1)
Sodium: 138 mEq/L (ref 135–145)
Total Bilirubin: 0.9 mg/dL (ref 0.2–1.2)
Total Protein: 8.3 g/dL (ref 6.0–8.3)

## 2019-01-04 LAB — CBC WITH DIFFERENTIAL/PLATELET
Basophils Absolute: 0 10*3/uL (ref 0.0–0.1)
Basophils Relative: 0.5 % (ref 0.0–3.0)
Eosinophils Absolute: 0.1 10*3/uL (ref 0.0–0.7)
Eosinophils Relative: 1.2 % (ref 0.0–5.0)
HCT: 41.6 % (ref 36.0–46.0)
Hemoglobin: 14.3 g/dL (ref 12.0–15.0)
Lymphocytes Relative: 42.3 % (ref 12.0–46.0)
Lymphs Abs: 2.3 10*3/uL (ref 0.7–4.0)
MCHC: 34.3 g/dL (ref 30.0–36.0)
MCV: 93.4 fl (ref 78.0–100.0)
Monocytes Absolute: 0.3 10*3/uL (ref 0.1–1.0)
Monocytes Relative: 4.9 % (ref 3.0–12.0)
Neutro Abs: 2.8 10*3/uL (ref 1.4–7.7)
Neutrophils Relative %: 51.1 % (ref 43.0–77.0)
Platelets: 211 10*3/uL (ref 150.0–400.0)
RBC: 4.46 Mil/uL (ref 3.87–5.11)
RDW: 13 % (ref 11.5–15.5)
WBC: 5.5 10*3/uL (ref 4.0–10.5)

## 2019-01-04 LAB — T4, FREE: Free T4: 0.95 ng/dL (ref 0.60–1.60)

## 2019-01-04 LAB — EXTRA SPECIMEN

## 2019-01-04 LAB — C-REACTIVE PROTEIN: CRP: <1 mg/dL (ref 0.5–20.0)

## 2019-01-04 LAB — TSH: TSH: 1.25 u[IU]/mL (ref 0.35–4.50)

## 2019-01-04 LAB — CORTISOL: Cortisol, Plasma: 4.5 ug/dL

## 2019-01-04 LAB — T3, FREE: T3, Free: 3.2 pg/mL (ref 2.3–4.2)

## 2019-01-04 LAB — SEDIMENTATION RATE: Sed Rate: 14 mm/hr (ref 0–20)

## 2019-01-08 LAB — CALCIUM, IONIZED

## 2019-01-08 LAB — PTH, INTACT AND CALCIUM
Calcium: 9.8 mg/dL (ref 8.6–10.2)
PTH: 11 pg/mL — ABNORMAL LOW (ref 14–64)

## 2019-01-08 LAB — DHEA: DHEA: 224 ng/dL

## 2019-01-10 ENCOUNTER — Encounter: Payer: Self-pay | Admitting: Family Medicine

## 2019-01-27 ENCOUNTER — Other Ambulatory Visit: Payer: Self-pay

## 2019-01-27 ENCOUNTER — Ambulatory Visit (INDEPENDENT_AMBULATORY_CARE_PROVIDER_SITE_OTHER): Payer: 59 | Admitting: Family Medicine

## 2019-01-27 VITALS — BP 100/62 | HR 97 | Ht 70.0 in

## 2019-01-27 DIAGNOSIS — M999 Biomechanical lesion, unspecified: Secondary | ICD-10-CM

## 2019-01-27 DIAGNOSIS — M3300 Juvenile dermatopolymyositis, organ involvement unspecified: Secondary | ICD-10-CM

## 2019-01-27 MED ORDER — PREDNISONE 50 MG PO TABS: ORAL_TABLET | ORAL | 0 refills | Status: AC

## 2019-01-27 NOTE — Patient Instructions (Signed)
Prednisone for next 5 days If not better, write me See me in 3-4 weeks

## 2019-01-27 NOTE — Progress Notes (Signed)
Tawana ScaleZach Smith D.O. [facility] [address] [place], [place] [address] Phone: [phone] Subjective:   Bruce Donath, Valerie Wolf, am serving as a scribe for Dr. Antoine PrimasZachary Smith.  I'm seeing this patient by the request  of:    CC: Left hand pain, joint pain  BJY:[id number]:Subjective   12/02/2018 Sprain of the interphalangeal joint.  Very minimal hypoechoic changes about the joint.  No significant avulsion noted.  Mild increase in Doppler flow.  Patient will brace for 2 weeks, icing regimen, history of autoimmune disease will need to monitor for any erosive changes.  Follow-up again in 4 weeks if not improved.  Update 01/27/2019 Ermalinda Barriosmily L Base is a 28 y.o. female coming in with complaint of left hand pain. Patient states hand seems to continue to do a popping thing that causes some discomfort and pain.  Patient states that she is having more and more pain overall.       Past Medical History:  Diagnosis Date  . Depression 2008   recurred in HS  . Dysmenorrhea 07/11/2010  . History of menorrhagia 07/11/2010  . Irritable bowel syndrome   . 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 Migraines middle school  . 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 COLONOSCOPY  2014   Dr. Jerral RalphSchooler--normal  . KNEE SURGERY Right    plica removal, arthroscopic  . TONSILLECTOMY  2000  . ULNA OSTEOTOMY Left 09/2014   Dr. Orlan Leavensrtman; ulna shortening  . WISDOM TOOTH EXTRACTION  2011   Social History   Socioeconomic History  . Marital status: Single    Spouse name: Not on file  . Number of children: Not on file  . Years of education: Not on file  . Highest education level: Not on file  Occupational History  . Occupation: Consulting civil engineerstudent  Social Needs  . Financial resource strain: Not on file  . Food insecurity    Worry: Not on file    Inability: Not on file  . Transportation needs    Medical: Not on file    Non-medical: Not on file   Tobacco Use  . Smoking status: Never Smoker  . Smokeless tobacco: Never Used  Substance and Sexual Activity  . Alcohol use: No    Alcohol/week: 0.0 standard drinks    Comment: infrequent  . Drug use: No  . Sexual activity: Never    Birth control/protection: Other-see comments    Comment: CELIBATE  Lifestyle  . Physical activity    Days per week: Not on file    Minutes per session: Not on file  . Stress: Not on file  Relationships  . Social Musicianconnections    Talks on phone: Not on file    Gets together: Not on file    Attends religious service: Not on file    Active member of club or organization: Not on file    Attends meetings of clubs or organizations: Not on file    Relationship status: Not on file  Other Topics Concern  . Not on file  Social History Narrative   Graduated from [employer].  Applying to grad school for school counseling (Masters in Education).  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   Allergies  Allergen Reactions  . Other     Seasonal allergies Seasonal allergies Plastic Tape  Family History  Problem Relation Age of Onset  . Hypertension Father   . Hypothyroidism Mother   . Hyperlipidemia Mother        borderline; good HDL, borderline LDL  . Allergies Mother   . Lupus Maternal Grandmother   . Heart disease Maternal Grandfather        CHF  . COPD Maternal Grandfather   . Asthma Maternal Grandfather   . Cancer Maternal Grandfather        thymic  . Stroke Paternal Grandfather 74  . Diabetes Paternal Grandfather     Current Outpatient Medications (Endocrine & Metabolic):  .  medroxyPROGESTERone (DEPO-PROVERA) 150 MG/ML injection, Inject 1 mL (150 mg total) into the muscle every 3 (three) months. .  predniSONE (DELTASONE) 50 MG tablet, Take one tablet daily for the next 5 days.   Current Outpatient Medications (Respiratory):  Marland Kitchen  Fexofenadine  HCl (ALLEGRA PO), Take 1 tablet by mouth daily. .  montelukast (SINGULAIR) 10 MG tablet, TAKE 1 TABLET BY MOUTH EVERYDAY AT BEDTIME    Current Outpatient Medications (Other):  .  calcium carbonate (OS-CAL) 600 MG TABS, Take 600 mg by mouth 2 (two) times daily with a meal. .  cholecalciferol (VITAMIN D) 1000 UNITS tablet, Take 1,000 Units by mouth daily. Marland Kitchen  gabapentin (NEURONTIN) 100 MG capsule, TAKE 2 CAPSULES (200 MG TOTAL) BY MOUTH AT BEDTIME. .  hydroxychloroquine (PLAQUENIL) 200 MG tablet, Take 400 mg by mouth daily.  Marland Kitchen  immune globulin, human, (GAMMAGARD S/D) 5 G injection, Inject into the vein. .  magnesium oxide (MAG-OX) 400 MG tablet, Take 1 tablet (400 mg total) by mouth daily. .  Multiple Vitamins-Minerals (HAIR/SKIN/NAILS PO), Take 1 tablet by mouth daily. Marland Kitchen  scopolamine (TRANSDERM-SCOP) 1 MG/3DAYS, Place 1 patch (1.5 mg total) onto the skin every 3 (three) days. Marland Kitchen  sertraline (ZOLOFT) 100 MG tablet, Take 150 mg by mouth daily.    Past medical history, social, surgical and family history all reviewed in electronic medical record.  No pertanent information unless stated regarding to the chief complaint.   Review of Systems:  No headache, visual changes, nausea, vomiting, diarrhea, constipation, dizziness, abdominal pain, skin rash, fevers, chills, night sweats, weight loss, swollen lymph nodes,chest pain, shortness of breath, mood changes.  Positive muscle aches, body aches  Objective  Blood pressure 100/62, pulse 97, height 5\' 10"  (1.778 m), SpO2 98 %.    General: No apparent distress alert and oriented x3 mood and affect normal, dressed appropriately.  HEENT: Pupils equal, extraocular movements intact  Respiratory: Patient's speak in full sentences and does not appear short of breath  Cardiovascular: No lower extremity edema, non tender, no erythema  Skin: Warm dry intact with no signs of infection or rash on extremities or on axial skeleton.  Abdomen: Soft nontender   Neuro: Cranial nerves II through XII are intact, neurovascularly intact in all extremities with 2+ DTRs and 2+ pulses.  Lymph: No lymphadenopathy of posterior or anterior cervical chain or axillae bilaterally.  Gait normal with good balance and coordination.  MSK:  tender with full range of motion and good stability and symmetric strength and tone of shoulders, elbows, wrist, hip, knee and ankles bilaterally.  Patient does have more of a synovitis of the hands bilaterally noted of the MCP joints.  Hypermobility noted  Back exam does have some tightness noted tender to palpation in the right sacroiliac joint right greater than left.  Negative straight leg test.  Tightness of the scapular dyskinesis  Osteopathic  findings  C4 flexed rotated and side bent left C6 flexed rotated and side bent left T3 extended rotated and side bent right inhaled third rib T9 extended rotated and side bent left      Impression and Recommendations:     This case required medical decision making of moderate complexity. The above documentation has been reviewed and is accurate and complete Judi Saa, DO       Note: This dictation was prepared with Dragon dictation along with smaller phrase technology. Any transcriptional errors that result from this process are unintentional.

## 2019-01-28 ENCOUNTER — Encounter: Payer: Self-pay | Admitting: Family Medicine

## 2019-01-28 NOTE — Assessment & Plan Note (Signed)
I believe the patient does have more of an exacerbation at the moment.  Patient does have some redness and erythema of the MCPs.  Prednisone given, responded well to manipulation, discussed posture and ergonomics, follow-up again 2 to 3 weeks

## 2019-01-28 NOTE — Assessment & Plan Note (Signed)
Decision today to treat with OMT was based on Physical Exam  After verbal consent patient was treated with HVLA, ME, FPR techniques in cervical, thoracic, rib areas  Patient tolerated the procedure well with improvement in symptoms  Patient given exercises, stretches and lifestyle modifications  See medications in patient instructions if given  Patient will follow up in 2-3 weeks 

## 2019-01-30 ENCOUNTER — Encounter: Payer: Self-pay | Admitting: Family Medicine

## 2019-01-31 ENCOUNTER — Other Ambulatory Visit: Payer: 59

## 2019-01-31 ENCOUNTER — Other Ambulatory Visit: Payer: Self-pay

## 2019-01-31 DIAGNOSIS — M2550 Pain in unspecified joint: Secondary | ICD-10-CM

## 2019-02-06 ENCOUNTER — Other Ambulatory Visit: Payer: Self-pay

## 2019-02-06 LAB — LYME DISEASE, WESTERN BLOT
IgG P28 Ab.: ABSENT
IgG P30 Ab.: ABSENT
IgG P39 Ab.: ABSENT
IgG P45 Ab.: ABSENT
IgG P58 Ab.: ABSENT
IgG P66 Ab.: ABSENT
IgM P23 Ab.: ABSENT
IgM P39 Ab.: ABSENT
IgM P41 Ab.: ABSENT
Lyme IgG Wb: NEGATIVE
Lyme IgM Wb: NEGATIVE

## 2019-02-06 MED ORDER — DOXYCYCLINE HYCLATE 100 MG PO TABS: 100.0000 mg | ORAL_TABLET | Freq: Two times a day (BID) | ORAL | 0 refills | Status: AC

## 2019-02-09 ENCOUNTER — Encounter: Payer: Self-pay | Admitting: Family Medicine

## 2019-02-09 ENCOUNTER — Telehealth: Payer: Self-pay | Admitting: Internal Medicine

## 2019-02-09 NOTE — Telephone Encounter (Signed)
Pt has been feeling poorly today since her work out this morning. After that started to feel bad.  Checked hr and bp and got concerned with HR being high since she is usually 60s-80s.  She is getting lightheaded and "woozy" when she stands up.  Thought she would pass out in the bathroom but did not.  When this happens she is feeling a "weirdness in my chest".  Not pain.  Also has had chills today, not febrile. Diagnosed with Lyme disease earlier this week by Dr. Gardenia Phlegm and started antibiotics.  Drinking a lot of water.  Appetite is normal.  Adv to contact Dr. Tamala Julian since he is treating her for Lyme disease. Adv to lay down, elevate legs for now.  Get up with assistance for safety. Continue to stay well hydrated.  Adv I would send to Dr. Harrington Challenger for review/ other recommendations.  Also adv if any other symptoms develop, such as headache, nausea, vomiting, change in vision or speech or any other, that she should be seen urgently in ER or urgent care.  Pt in agreement with this plan.

## 2019-02-09 NOTE — Telephone Encounter (Signed)
Pt C/O BP issue: STAT if pt C/O blurred vision, one-sided weakness or slurred speech  1. What are your last 5 BP readings? 133/81 lying down       99/66   2. Are you having any other symptoms (ex. Dizziness, headache, blurred vision, passed out)? Very lightheaded, SOB, felt uneasiness in her chest it wasn't chest pain.   3. What is your BP issue? States her HR in the mid 90's to 103.  Patient was recently diagnosed with Lyme disease. States this is the complete opposite of what they last discussed.

## 2019-02-17 ENCOUNTER — Other Ambulatory Visit: Payer: Self-pay

## 2019-02-17 ENCOUNTER — Encounter: Payer: Self-pay | Admitting: Family Medicine

## 2019-02-17 ENCOUNTER — Ambulatory Visit (INDEPENDENT_AMBULATORY_CARE_PROVIDER_SITE_OTHER): Payer: 59 | Admitting: Family Medicine

## 2019-02-17 VITALS — BP 112/72 | HR 89 | Ht 70.0 in | Wt 158.0 lb

## 2019-02-17 DIAGNOSIS — M999 Biomechanical lesion, unspecified: Secondary | ICD-10-CM

## 2019-02-17 DIAGNOSIS — G2589 Other specified extrapyramidal and movement disorders: Secondary | ICD-10-CM | POA: Diagnosis not present

## 2019-02-17 NOTE — Patient Instructions (Signed)
Good to see you  Follow up in 4-6 weeks 

## 2019-02-17 NOTE — Progress Notes (Signed)
Michele Burke  D.O. Michele Burke [address] [place], [place] [address] Phone: [phone] Subjective:   I Michele NighKana Thompson am serving as a Neurosurgeonscribe for Dr. Antoine PrimasZachary .  I'm seeing this patient by the request  of:    CC: Polyarthralgia follow-up  NUU:[id number]:Subjective  Michele Burke is a 28 y.o. female coming in with complaint of back pain. States she is doing well. OMT. Patient did have a Lyme disease titer and seems to be mildly positive with 4 distinct positive tests with all of them being more positive with the higher aspects with the alleles. Patient wanted to try adding doxycycline.  Patient is feeling better.  Feels like she is making some increase in energy.  Hoping to continue this.  Past medical history is though still significant for angiogram around the lateral myositis    Past Medical History:  Diagnosis Date  . Depression 2008   recurred in HS  . Dysmenorrhea 07/11/2010  . History of menorrhagia 07/11/2010  . Irritable bowel syndrome   . 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 Migraines middle school  . 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 COLONOSCOPY  2014   Dr. Jerral RalphSchooler--normal  . KNEE SURGERY Right    plica removal, arthroscopic  . TONSILLECTOMY  2000  . ULNA OSTEOTOMY Left 09/2014   Dr. Orlan Leavensrtman; ulna shortening  . WISDOM TOOTH EXTRACTION  2011   Social History   Socioeconomic History  . Marital status: Single    Spouse name: Not on file  . Number of children: Not on file  . Years of education: Not on file  . Highest education level: Not on file  Occupational History  . Occupation: Consulting civil engineerstudent  Social Needs  . Financial resource strain: Not on file  . Food insecurity    Worry: Not on file    Inability: Not on file  . Transportation needs    Medical: Not on file    Non-medical: Not on file  Tobacco Use  . Smoking status: Never Smoker  . Smokeless tobacco: Never Used   Substance and Sexual Activity  . Alcohol use: No    Alcohol/week: 0.0 standard drinks    Comment: infrequent  . Drug use: No  . Sexual activity: Never    Birth control/protection: Other-see comments    Comment: CELIBATE  Lifestyle  . Physical activity    Days per week: Not on file    Minutes per session: Not on file  . Stress: Not on file  Relationships  . Social Musicianconnections    Talks on phone: Not on file    Gets together: Not on file    Attends religious service: Not on file    Active member of club or organization: Not on file    Attends meetings of clubs or organizations: Not on file    Relationship status: Not on file  Other Topics Concern  . Not on file  Social History Narrative   Graduated from [employer].  Applying to grad school for school counseling (Masters in Education).  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   Allergies  Allergen Reactions  . Other     Seasonal allergies Seasonal allergies Plastic Tape   Family History  Problem Relation Age of Onset  . Hypertension Father   .  Hypothyroidism Mother   . Hyperlipidemia Mother        borderline; good HDL, borderline LDL  . Allergies Mother   . Lupus Maternal Grandmother   . Heart disease Maternal Grandfather        CHF  . COPD Maternal Grandfather   . Asthma Maternal Grandfather   . Cancer Maternal Grandfather        thymic  . Stroke Paternal Grandfather 84  . Diabetes Paternal Grandfather     Current Outpatient Medications (Endocrine & Metabolic):  .  medroxyPROGESTERone (DEPO-PROVERA) 150 MG/ML injection, Inject 1 mL (150 mg total) into the muscle every 3 (three) months. .  predniSONE (DELTASONE) 50 MG tablet, Take one tablet daily for the next 5 days.   Current Outpatient Medications (Respiratory):  Marland Kitchen  Fexofenadine HCl (ALLEGRA PO), Take 1 tablet by mouth daily. .  montelukast (SINGULAIR) 10 MG  tablet, TAKE 1 TABLET BY MOUTH EVERYDAY AT BEDTIME    Current Outpatient Medications (Other):  .  calcium carbonate (OS-CAL) 600 MG TABS, Take 600 mg by mouth 2 (two) times daily with a meal. .  cholecalciferol (VITAMIN D) 1000 UNITS tablet, Take 1,000 Units by mouth daily. Marland Kitchen  doxycycline (VIBRA-TABS) 100 MG tablet, Take 1 tablet (100 mg total) by mouth 2 (two) times daily. Marland Kitchen  gabapentin (NEURONTIN) 100 MG capsule, TAKE 2 CAPSULES (200 MG TOTAL) BY MOUTH AT BEDTIME. .  hydroxychloroquine (PLAQUENIL) 200 MG tablet, Take 400 mg by mouth daily.  Marland Kitchen  immune globulin, human, (GAMMAGARD S/D) 5 G injection, Inject into the vein. .  magnesium oxide (MAG-OX) 400 MG tablet, Take 1 tablet (400 mg total) by mouth daily. .  Multiple Vitamins-Minerals (HAIR/SKIN/NAILS PO), Take 1 tablet by mouth daily. Marland Kitchen  scopolamine (TRANSDERM-SCOP) 1 MG/3DAYS, Place 1 patch (1.5 mg total) onto the skin every 3 (three) days. Marland Kitchen  sertraline (ZOLOFT) 100 MG tablet, Take 150 mg by mouth daily.    Past medical history, social, surgical and family history all reviewed in electronic medical record.  No pertanent information unless stated regarding to the chief complaint.   Review of Systems:  No headache, visual changes, nausea, vomiting, diarrhea, constipation, dizziness, abdominal pain, skin rash, fevers, chills, night sweats, weight loss, swollen lymph nodes,, joint swelling chest pain, shortness of breath, mood changes.  Positive muscle aches and body aches  Objective  Blood pressure 112/72, pulse 89, height 5\' 10"  (1.778 m), weight 158 lb (71.7 kg), SpO2 98 %.    General: No apparent distress alert and oriented x3 mood and affect normal, dressed appropriately.  HEENT: Pupils equal, extraocular movements intact  Respiratory: Patient's speak in full sentences and does not appear short of breath  Cardiovascular: No lower extremity edema, non tender, no erythema  Skin: Warm dry intact with no signs of infection or rash on  extremities or on axial skeleton.  Abdomen: Soft nontender  Neuro: Cranial nerves II through XII are intact, neurovascularly intact in all extremities with 2+ DTRs and 2+ pulses.  Lymph: No lymphadenopathy of posterior or anterior cervical chain or axillae bilaterally.  Gait normal with good balance and coordination.  MSK:  tender with full range of motion and good stability and symmetric strength and tone of shoulders, elbows, wrist, hip, knee and ankles bilaterally.  Back exam does show some very mild increase in kyphosis of the upper thoracic spine.  Tender to palpation more in the thoracolumbar and lumbosacral areas. Diffuse tenderness to palpation in the paraspinal musculature.  Osteopathic findings  C2  flexed rotated and side bent right C4 flexed rotated and side bent left C6 flexed rotated and side bent left T3 extended rotated and side bent right inhaled third rib T9 extended rotated and side bent left     Impression and Recommendations:     This case required medical decision making of moderate complexity. The above documentation has been reviewed and is accurate and complete Judi Saa, DO       Note: This dictation was prepared with Dragon dictation along with smaller phrase technology. Any transcriptional errors that result from this process are unintentional.

## 2019-02-17 NOTE — Assessment & Plan Note (Signed)
Multiple comorbidities including benign hypermobility, dermatomyositis, and the possibility of Lyme disease.  Was well controlled.  Patient has been responding somewhat.  Patient is going to increase activity as tolerated.  Follow-up again in 4 to 8 weeks.

## 2019-02-17 NOTE — Assessment & Plan Note (Signed)
Decision today to treat with OMT was based on Physical Exam  After verbal consent patient was treated with HVLA, ME, FPR techniques in cervical, thoracic, rib areas  Patient tolerated the procedure well with improvement in symptoms  Patient given exercises, stretches and lifestyle modifications  See medications in patient instructions if given  Patient will follow up in 4-8 weeks 

## 2019-02-23 ENCOUNTER — Encounter: Payer: Self-pay | Admitting: Family Medicine

## 2019-03-21 ENCOUNTER — Encounter: Payer: Self-pay | Admitting: Family Medicine

## 2019-03-21 ENCOUNTER — Ambulatory Visit (INDEPENDENT_AMBULATORY_CARE_PROVIDER_SITE_OTHER): Payer: 59 | Admitting: Family Medicine

## 2019-03-21 ENCOUNTER — Other Ambulatory Visit: Payer: Self-pay

## 2019-03-21 VITALS — BP 132/70 | HR 88 | Ht 70.0 in | Wt 158.0 lb

## 2019-03-21 DIAGNOSIS — M999 Biomechanical lesion, unspecified: Secondary | ICD-10-CM

## 2019-03-21 DIAGNOSIS — I7300 Raynaud's syndrome without gangrene: Secondary | ICD-10-CM

## 2019-03-21 DIAGNOSIS — M79642 Pain in left hand: Secondary | ICD-10-CM | POA: Diagnosis not present

## 2019-03-21 DIAGNOSIS — S6000XD Contusion of unspecified finger without damage to nail, subsequent encounter: Secondary | ICD-10-CM

## 2019-03-21 DIAGNOSIS — G2589 Other specified extrapyramidal and movement disorders: Secondary | ICD-10-CM

## 2019-03-21 MED ORDER — AMLODIPINE BESYLATE 2.5 MG PO TABS: 2.5000 mg | ORAL_TABLET | Freq: Every day | ORAL | 3 refills | Status: AC

## 2019-03-21 NOTE — Assessment & Plan Note (Signed)
Stable overall.  Discussed icing regimen and home exercise, discussed which activities to do which wants to avoid.  Patient should increase activity slowly over the course of next several weeks.  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  Follow-up again in 4 months.

## 2019-03-21 NOTE — Progress Notes (Signed)
[facility] [place], [place] [address] Phone: [phone] Subjective:   I Michele Burke am serving as a Education administrator for Dr. Hulan Saas.   CC: Low back pain  [id number]  Michele Burke is a 28 y.o. female coming in with complaint of back pain. States she is doing well. Left hand is still painful.  Patient states that the back pain seems to be about stable but still having pain on a daily basis.  Sometimes moves in a different way and causes increasing discomfort and pain.  Denies any radiation down the legs at the moment but does notice then having more blue toes that seem to be very painful from going from outside to inside.     Past Medical History:  Diagnosis Date  . Depression 2008   recurred in HS  . Dysmenorrhea 07/11/2010  . History of menorrhagia 07/11/2010  . Irritable bowel syndrome   . 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 Migraines middle school  . 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 COLONOSCOPY  2014   Dr. Lelan Pons  . KNEE SURGERY Right    plica removal, arthroscopic  . TONSILLECTOMY  2000  . ULNA OSTEOTOMY Left 09/2014   Dr. Apolonio Schneiders; ulna shortening  . WISDOM TOOTH EXTRACTION  2011   Social History   Socioeconomic History  . Marital status: Single    Spouse name: Not on file  . Number of children: Not on file  . Years of education: Not on file  . Highest education level: Not on file  Occupational History  . Occupation: Ship broker  Social Needs  . Financial resource strain: Not on file  . Food insecurity    Worry: Not on file    Inability: Not on file  . Transportation needs    Medical: Not on file    Non-medical: Not on file  Tobacco Use  . Smoking status: Never Smoker  . Smokeless tobacco: Never Used  Substance and Sexual Activity  . Alcohol use: No    Alcohol/week: 0.0 standard drinks    Comment: infrequent   . Drug use: No  . Sexual activity: Never    Birth control/protection: Other-see comments    Comment: CELIBATE  Lifestyle  . Physical activity    Days per week: Not on file    Minutes per session: Not on file  . Stress: Not on file  Relationships  . Social Herbalist on phone: Not on file    Gets together: Not on file    Attends religious service: Not on file    Active member of club or organization: Not on file    Attends meetings of clubs or organizations: Not on file    Relationship status: Not on file  Other Topics Concern  . Not on file  Social History Narrative   Graduated from [employer].  Applying to grad school for school counseling (Masters in Education).  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   Allergies  Allergen Reactions  . Other     Seasonal allergies Seasonal allergies Plastic Tape   Family History  Problem Relation Age of Onset  . Hypertension Father   . Hypothyroidism Mother   . Hyperlipidemia Mother  borderline; good HDL, borderline LDL  . Allergies Mother   . Lupus Maternal Grandmother   . Heart disease Maternal Grandfather        CHF  . COPD Maternal Grandfather   . Asthma Maternal Grandfather   . Cancer Maternal Grandfather        thymic  . Stroke Paternal Grandfather 961  . Diabetes Paternal Grandfather     Current Outpatient Medications (Endocrine & Metabolic):  .  medroxyPROGESTERone (DEPO-PROVERA) 150 MG/ML injection, Inject 1 mL (150 mg total) into the muscle every 3 (three) months. .  predniSONE (DELTASONE) 50 MG tablet, Take one tablet daily for the next 5 days.  Current Outpatient Medications (Cardiovascular):  .  amLODipine (NORVASC) 2.5 MG tablet, Take 1 tablet (2.5 mg total) by mouth daily.  Current Outpatient Medications (Respiratory):  Marland Kitchen.  Fexofenadine HCl (ALLEGRA PO), Take 1 tablet by mouth daily. .   montelukast (SINGULAIR) 10 MG tablet, TAKE 1 TABLET BY MOUTH EVERYDAY AT BEDTIME    Current Outpatient Medications (Other):  .  calcium carbonate (OS-CAL) 600 MG TABS, Take 600 mg by mouth 2 (two) times daily with a meal. .  cholecalciferol (VITAMIN D) 1000 UNITS tablet, Take 1,000 Units by mouth daily. Marland Kitchen.  doxycycline (VIBRA-TABS) 100 MG tablet, Take 1 tablet (100 mg total) by mouth 2 (two) times daily. Marland Kitchen.  gabapentin (NEURONTIN) 100 MG capsule, TAKE 2 CAPSULES (200 MG TOTAL) BY MOUTH AT BEDTIME. .  hydroxychloroquine (PLAQUENIL) 200 MG tablet, Take 400 mg by mouth daily.  Marland Kitchen.  immune globulin, human, (GAMMAGARD S/D) 5 G injection, Inject into the vein. .  magnesium oxide (MAG-OX) 400 MG tablet, Take 1 tablet (400 mg total) by mouth daily. .  Multiple Vitamins-Minerals (HAIR/SKIN/NAILS PO), Take 1 tablet by mouth daily. Marland Kitchen.  scopolamine (TRANSDERM-SCOP) 1 MG/3DAYS, Place 1 patch (1.5 mg total) onto the skin every 3 (three) days. Marland Kitchen.  sertraline (ZOLOFT) 100 MG tablet, Take 150 mg by mouth daily.    Past medical history, social, surgical and family history all reviewed in electronic medical record.  No pertanent information unless stated regarding to the chief complaint.   Review of Systems:  No headache, visual changes, nausea, vomiting, diarrhea, constipation, dizziness, abdominal pain, skin rash, fevers, chills, night sweats, weight loss, swollen lymph nodes, body aches, joint swelling,  chest pain, shortness of breath, mood changes.  Positive muscle aches  Objective  Blood pressure 132/70, pulse 88, height 5\' 10"  (1.778 m), weight 158 lb (71.7 kg), SpO2 98 %.    General: No apparent distress alert and oriented x3 mood and affect normal, dressed appropriately.  HEENT: Pupils equal, extraocular movements intact  Respiratory: Patient's speak in full sentences and does not appear short of breath  Cardiovascular: No lower extremity edema, non tender, no erythema  Skin: Warm dry intact with no  signs of infection or rash on extremities or on axial skeleton.  Abdomen: Soft nontender  Neuro: Cranial nerves II through XII are intact, neurovascularly intact in all extremities with 2+ DTRs and 2+ pulses.  Lymph: No lymphadenopathy of posterior or anterior cervical chain or axillae bilaterally.  Gait normal with good balance and coordination.  MSK:  tender with limited range of motion and good stability and symmetric strength and tone of shoulders, elbows, wrist, hip, knee and ankles bilaterally.  Hypermobility. Patient does have some tenderness diffusely in the paraspinal musculature of the cervical, thoracic and lumbar.  Negative straight leg test, 5 out of 5 strength of all the extremities.  Patient does have a bluish hue to her toes but no true swelling.  Seems to be neurovascularly intact  Osteopathic findings C2 flexed rotated and side bent right C4 flexed rotated and side bent left C6 flexed rotated and side bent left T5 extended rotated and side bent right inhaled third rib     Impression and Recommendations:     This case required medical decision making of moderate complexity. The above documentation has been reviewed and is accurate and complete Judi Saa, DO       Note: This dictation was prepared with Dragon dictation along with smaller phrase technology. Any transcriptional errors that result from this process are unintentional.

## 2019-03-21 NOTE — Patient Instructions (Addendum)
Good to see you Amlodipine 2.5 mg daily Making improvements keep it up See me again in 4-6 weeks

## 2019-03-21 NOTE — Assessment & Plan Note (Signed)
Decision today to treat with OMT was based on Physical Exam  After verbal consent patient was treated with HVLA, ME, FPR techniques in cervical, thoracic, rib areas  Patient tolerated the procedure well with improvement in symptoms  Patient given exercises, stretches and lifestyle modifications  See medications in patient instructions if given  Patient will follow up in 4-8 weeks 

## 2019-03-21 NOTE — Assessment & Plan Note (Addendum)
Continues to have some mild pain.  Sent to physical therapy discussed with patient icing regimen and home exercises.  Follow-up again in 4 to 8 weeks

## 2019-04-19 ENCOUNTER — Ambulatory Visit: Payer: Self-pay

## 2019-04-19 ENCOUNTER — Ambulatory Visit (INDEPENDENT_AMBULATORY_CARE_PROVIDER_SITE_OTHER): Payer: 59 | Admitting: Family Medicine

## 2019-04-19 ENCOUNTER — Other Ambulatory Visit: Payer: Self-pay

## 2019-04-19 ENCOUNTER — Encounter: Payer: Self-pay | Admitting: Family Medicine

## 2019-04-19 VITALS — BP 106/70 | HR 107 | Ht 70.0 in | Wt 162.0 lb

## 2019-04-19 DIAGNOSIS — M25572 Pain in left ankle and joints of left foot: Secondary | ICD-10-CM

## 2019-04-19 DIAGNOSIS — S93622A Sprain of tarsometatarsal ligament of left foot, initial encounter: Secondary | ICD-10-CM

## 2019-04-19 MED ORDER — VITAMIN D (ERGOCALCIFEROL) 1.25 MG (50000 UNIT) PO CAPS: 50000.0000 [IU] | ORAL_CAPSULE | ORAL | 0 refills | Status: AC

## 2019-04-19 NOTE — Progress Notes (Signed)
Tawana ScaleZach Rees Matura D.O. [facility] [address] [place], [place] [address] Phone: [phone] Subjective:   Bruce Donath, Valerie Wolf, am serving as a scribe for Dr. Antoine PrimasZachary Chaselyn Nanney.  I'm seeing this patient by the request  of:    This visit occurred during the SARS-CoV-2 public health emergency.  Safety protocols were in place, including screening questions prior to the visit, additional usage of staff PPE, and extensive cleaning of exam room while observing appropriate contact time as indicated for disinfecting solutions.     CC: Left foot and ankle pain  BJY:[id number]:Subjective   02/17/2019 OMT  Update 04/19/2019 Michele Burke is a 28 y.o. female coming in with complaint of back and thumb pain. No change in either of these.   Patient states that 2 weeks ago she twisted her ankle during a workout. Pain continues over lateral malleolus and foot. Patient is wearing cam walker which is helping to decrease her pain. Patient said that her toes went numb yesterday but she had her foot down as she was riding in a golf cart.      Past Medical History:  Diagnosis Date  . Depression 2008   recurred in HS  . Dysmenorrhea 07/11/2010  . History of menorrhagia 07/11/2010  . Irritable bowel syndrome   . 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 Migraines middle school  . 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 COLONOSCOPY  2014   Dr. Jerral RalphSchooler--normal  . KNEE SURGERY Right    plica removal, arthroscopic  . TONSILLECTOMY  2000  . ULNA OSTEOTOMY Left 09/2014   Dr. Orlan Leavensrtman; ulna shortening  . WISDOM TOOTH EXTRACTION  2011   Social History   Socioeconomic History  . Marital status: Single    Spouse name: Not on file  . Number of children: Not on file  . Years of education: Not on file  . Highest education level: Not on file  Occupational History  . Occupation: Consulting civil engineerstudent  Social Needs  . Financial resource strain: Not  on file  . Food insecurity    Worry: Not on file    Inability: Not on file  . Transportation needs    Medical: Not on file    Non-medical: Not on file  Tobacco Use  . Smoking status: Never Smoker  . Smokeless tobacco: Never Used  Substance and Sexual Activity  . Alcohol use: No    Alcohol/week: 0.0 standard drinks    Comment: infrequent  . Drug use: No  . Sexual activity: Never    Birth control/protection: Other-see comments    Comment: CELIBATE  Lifestyle  . Physical activity    Days per week: Not on file    Minutes per session: Not on file  . Stress: Not on file  Relationships  . Social Musicianconnections    Talks on phone: Not on file    Gets together: Not on file    Attends religious service: Not on file    Active member of club or organization: Not on file    Attends meetings of clubs or organizations: Not on file    Relationship status: Not on file  Other Topics Concern  . Not on file  Social History Narrative   Graduated from [employer].  Applying to grad school for school counseling (Masters in Education).  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   Allergies  Allergen Reactions  . Other     Seasonal allergies Seasonal allergies Plastic Tape   Family History  Problem Relation Age of Onset  . Hypertension Father   . Hypothyroidism Mother   . Hyperlipidemia Mother        borderline; good HDL, borderline LDL  . Allergies Mother   . Lupus Maternal Grandmother   . Heart disease Maternal Grandfather        CHF  . COPD Maternal Grandfather   . Asthma Maternal Grandfather   . Cancer Maternal Grandfather        thymic  . Stroke Paternal Grandfather 57  . Diabetes Paternal Grandfather     Current Outpatient Medications (Endocrine & Metabolic):  .  medroxyPROGESTERone (DEPO-PROVERA) 150 MG/ML injection, Inject 1 mL (150 mg total) into the muscle every 3 (three)  months. .  predniSONE (DELTASONE) 50 MG tablet, Take one tablet daily for the next 5 days.  Current Outpatient Medications (Cardiovascular):  .  amLODipine (NORVASC) 2.5 MG tablet, Take 1 tablet (2.5 mg total) by mouth daily.  Current Outpatient Medications (Respiratory):  Marland Kitchen  Fexofenadine HCl (ALLEGRA PO), Take 1 tablet by mouth daily. .  montelukast (SINGULAIR) 10 MG tablet, TAKE 1 TABLET BY MOUTH EVERYDAY AT BEDTIME    Current Outpatient Medications (Other):  .  calcium carbonate (OS-CAL) 600 MG TABS, Take 600 mg by mouth 2 (two) times daily with a meal. .  cholecalciferol (VITAMIN D) 1000 UNITS tablet, Take 1,000 Units by mouth daily. Marland Kitchen  doxycycline (VIBRA-TABS) 100 MG tablet, Take 1 tablet (100 mg total) by mouth 2 (two) times daily. Marland Kitchen  gabapentin (NEURONTIN) 100 MG capsule, TAKE 2 CAPSULES (200 MG TOTAL) BY MOUTH AT BEDTIME. .  hydroxychloroquine (PLAQUENIL) 200 MG tablet, Take 400 mg by mouth daily.  Marland Kitchen  immune globulin, human, (GAMMAGARD S/D) 5 G injection, Inject into the vein. .  magnesium oxide (MAG-OX) 400 MG tablet, Take 1 tablet (400 mg total) by mouth daily. .  Multiple Vitamins-Minerals (HAIR/SKIN/NAILS PO), Take 1 tablet by mouth daily. Marland Kitchen  scopolamine (TRANSDERM-SCOP) 1 MG/3DAYS, Place 1 patch (1.5 mg total) onto the skin every 3 (three) days. Marland Kitchen  sertraline (ZOLOFT) 100 MG tablet, Take 150 mg by mouth daily. .  Vitamin D, Ergocalciferol, (DRISDOL) 1.25 MG (50000 UT) CAPS capsule, Take 1 capsule (50,000 Units total) by mouth every 7 (seven) days.    Past medical history, social, surgical and family history all reviewed in electronic medical record.  No pertanent information unless stated regarding to the chief complaint.   Review of Systems:  No headache, visual changes, nausea, vomiting, diarrhea, constipation, dizziness, abdominal pain, skin rash, fevers, chills, night sweats, weight loss, swollen lymph nodes, body aches, joint swelling,  chest pain, shortness of breath,  mood changes.  Positive muscle aches  Objective  Blood pressure 106/70, pulse (!) 107, height 5\' 10"  (1.778 m), weight 162 lb (73.5 kg), SpO2 97 %.    General: No apparent distress alert and oriented x3 mood and affect normal, dressed appropriately.  HEENT: Pupils equal, extraocular movements intact  Respiratory: Patient's speak in full sentences and does not appear short of breath  Cardiovascular: No lower extremity edema, non tender, no erythema  Skin: Warm dry intact with no signs of infection or rash on extremities or on axial skeleton.  Abdomen: Soft nontender  Neuro: Cranial nerves II through XII are intact, neurovascularly intact in all extremities with 2+ DTRs  and 2+ pulses.  Lymph: No lymphadenopathy of posterior or anterior cervical chain or axillae bilaterally.  Gait antalgic walking in a long cam walker MSK:  tender with mild limited range of motion and good stability and symmetric strength and tone of shoulders, elbows, wrist, hip, knee bilaterally.  Patient does have hypermobility of other joints.  Left foot exam shows the patient does have bruising over the foot noted.  Trace swelling of the ankle itself.  Full range of motion of the ankle with minimal pain over the lateral malleolus.  Moderate to severe tenderness over the Lisfranc joint of the foot.  Neurovascularly intact distally.  Limited musculoskeletal ultrasound was performed and interpreted by Judi Saa  Limited ultrasound of patient's ankle does not show any lateral malleolus fracture noted.  Mild peroneal tendon sheath effusion noted but no true tear.  Patient does have what appears to be a fairly large injury of the Lisfranc ligament with a small effusion of the mid foot.  No cortical avulsion though noted Impression: Lisfranc injury   Impression and Recommendations:     This case required medical decision making of moderate complexity. The above documentation has been reviewed and is accurate and complete  Judi Saa, DO       Note: This dictation was prepared with Dragon dictation along with smaller phrase technology. Any transcriptional errors that result from this process are unintentional.

## 2019-04-19 NOTE — Assessment & Plan Note (Signed)
Patient has an injury noted.  Likely this is going to take some time.  Patient has other underlying problems that likely will contribute to some of the discomfort and pain.  Discussed with patient about CAM Walker daily for the next 4 weeks and then further evaluation with ultrasound again.  Continue vitamin D supplementation discussed other over-the-counter medications.

## 2019-04-19 NOTE — Patient Instructions (Signed)
When seated come out of boot and move around See me again in 4-5 weeks K2 2100mcg daily Once weekly Vitamin D

## 2019-04-20 ENCOUNTER — Encounter: Payer: Self-pay | Admitting: Family Medicine

## 2019-05-12 ENCOUNTER — Other Ambulatory Visit: Payer: Self-pay | Admitting: Family Medicine

## 2019-05-17 ENCOUNTER — Ambulatory Visit: Payer: 59 | Admitting: Family Medicine

## 2019-12-27 IMAGING — DX CHEST - 2 VIEW
2 series · 2 of 2 positions shown · non-contrast
Comparison: Lung 315

CLINICAL DATA: Chest pain, shortness of breath

EXAM:
CHEST - 2 VIEW

[chest pa]
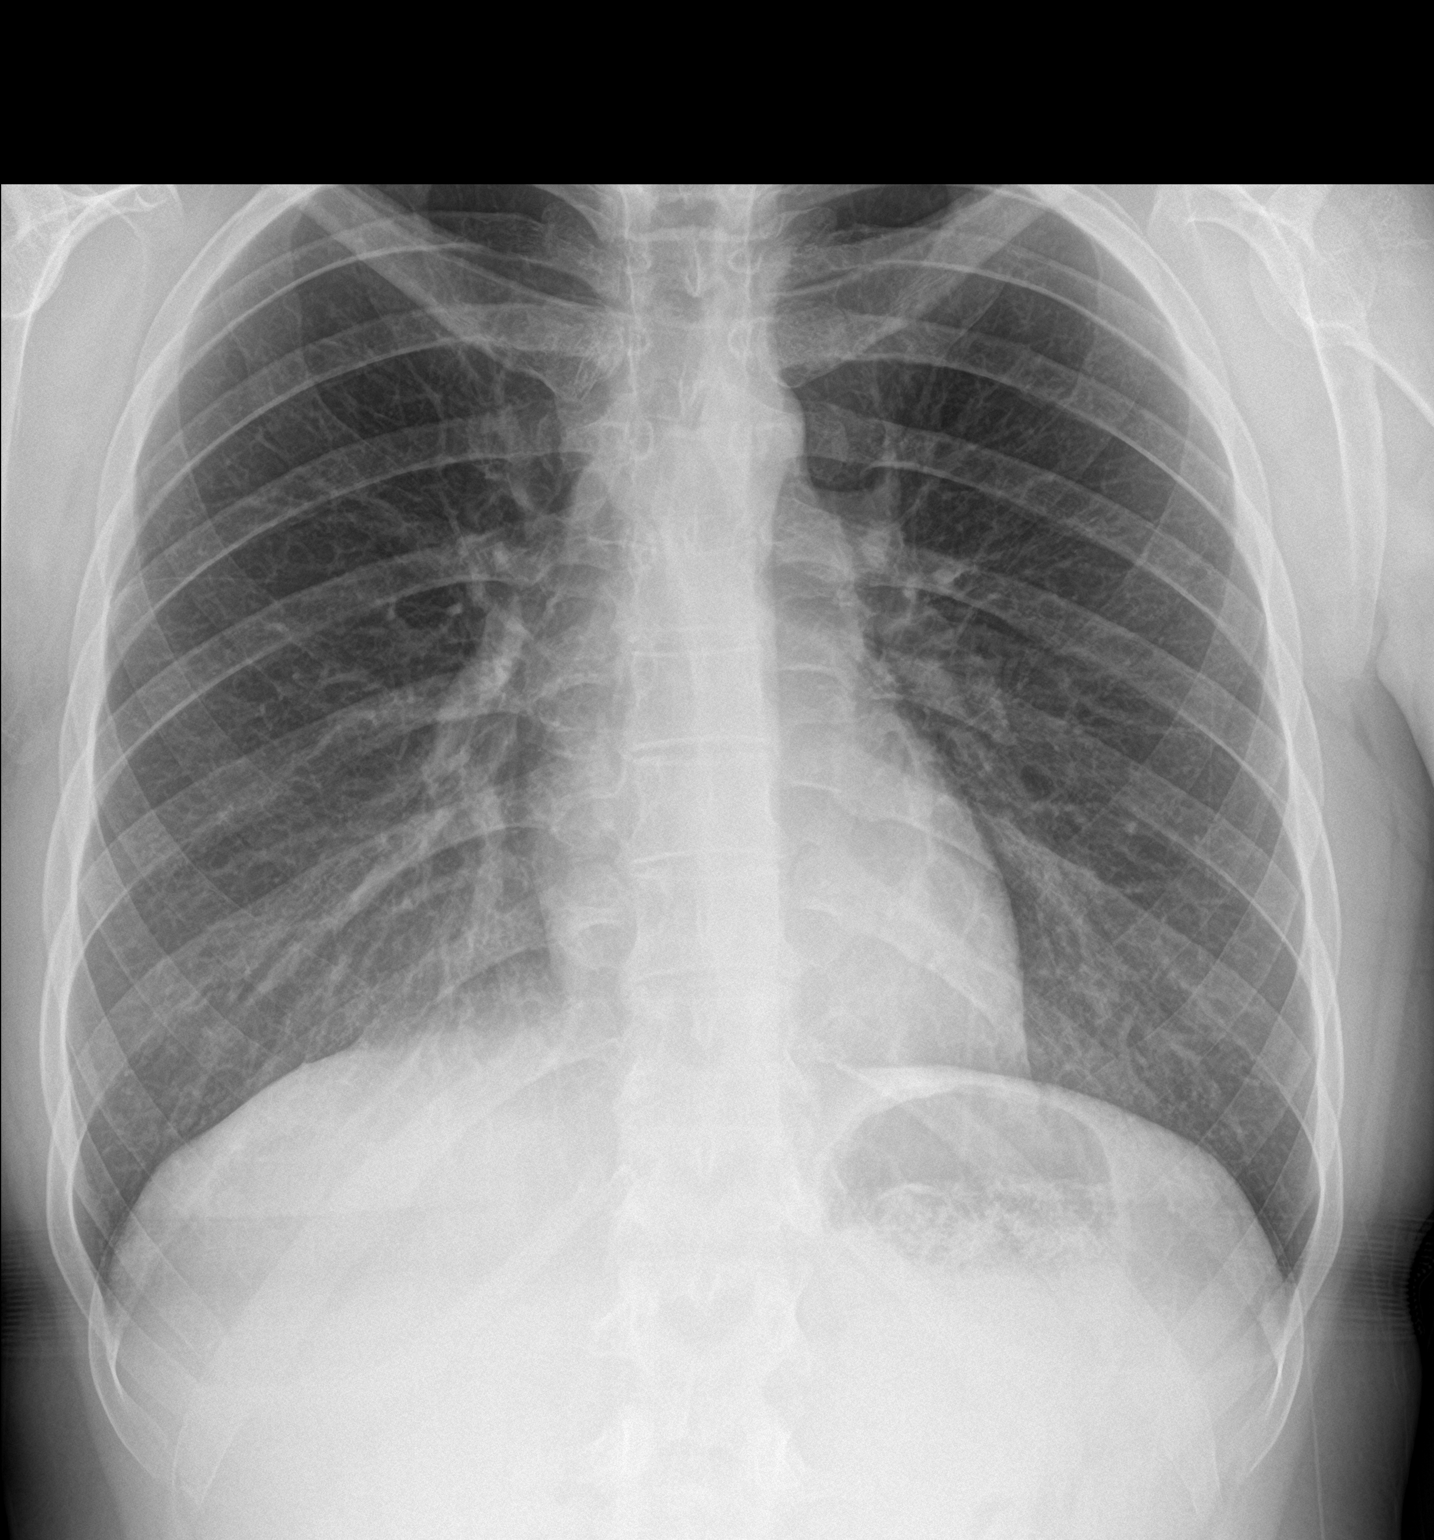

[chest lat]
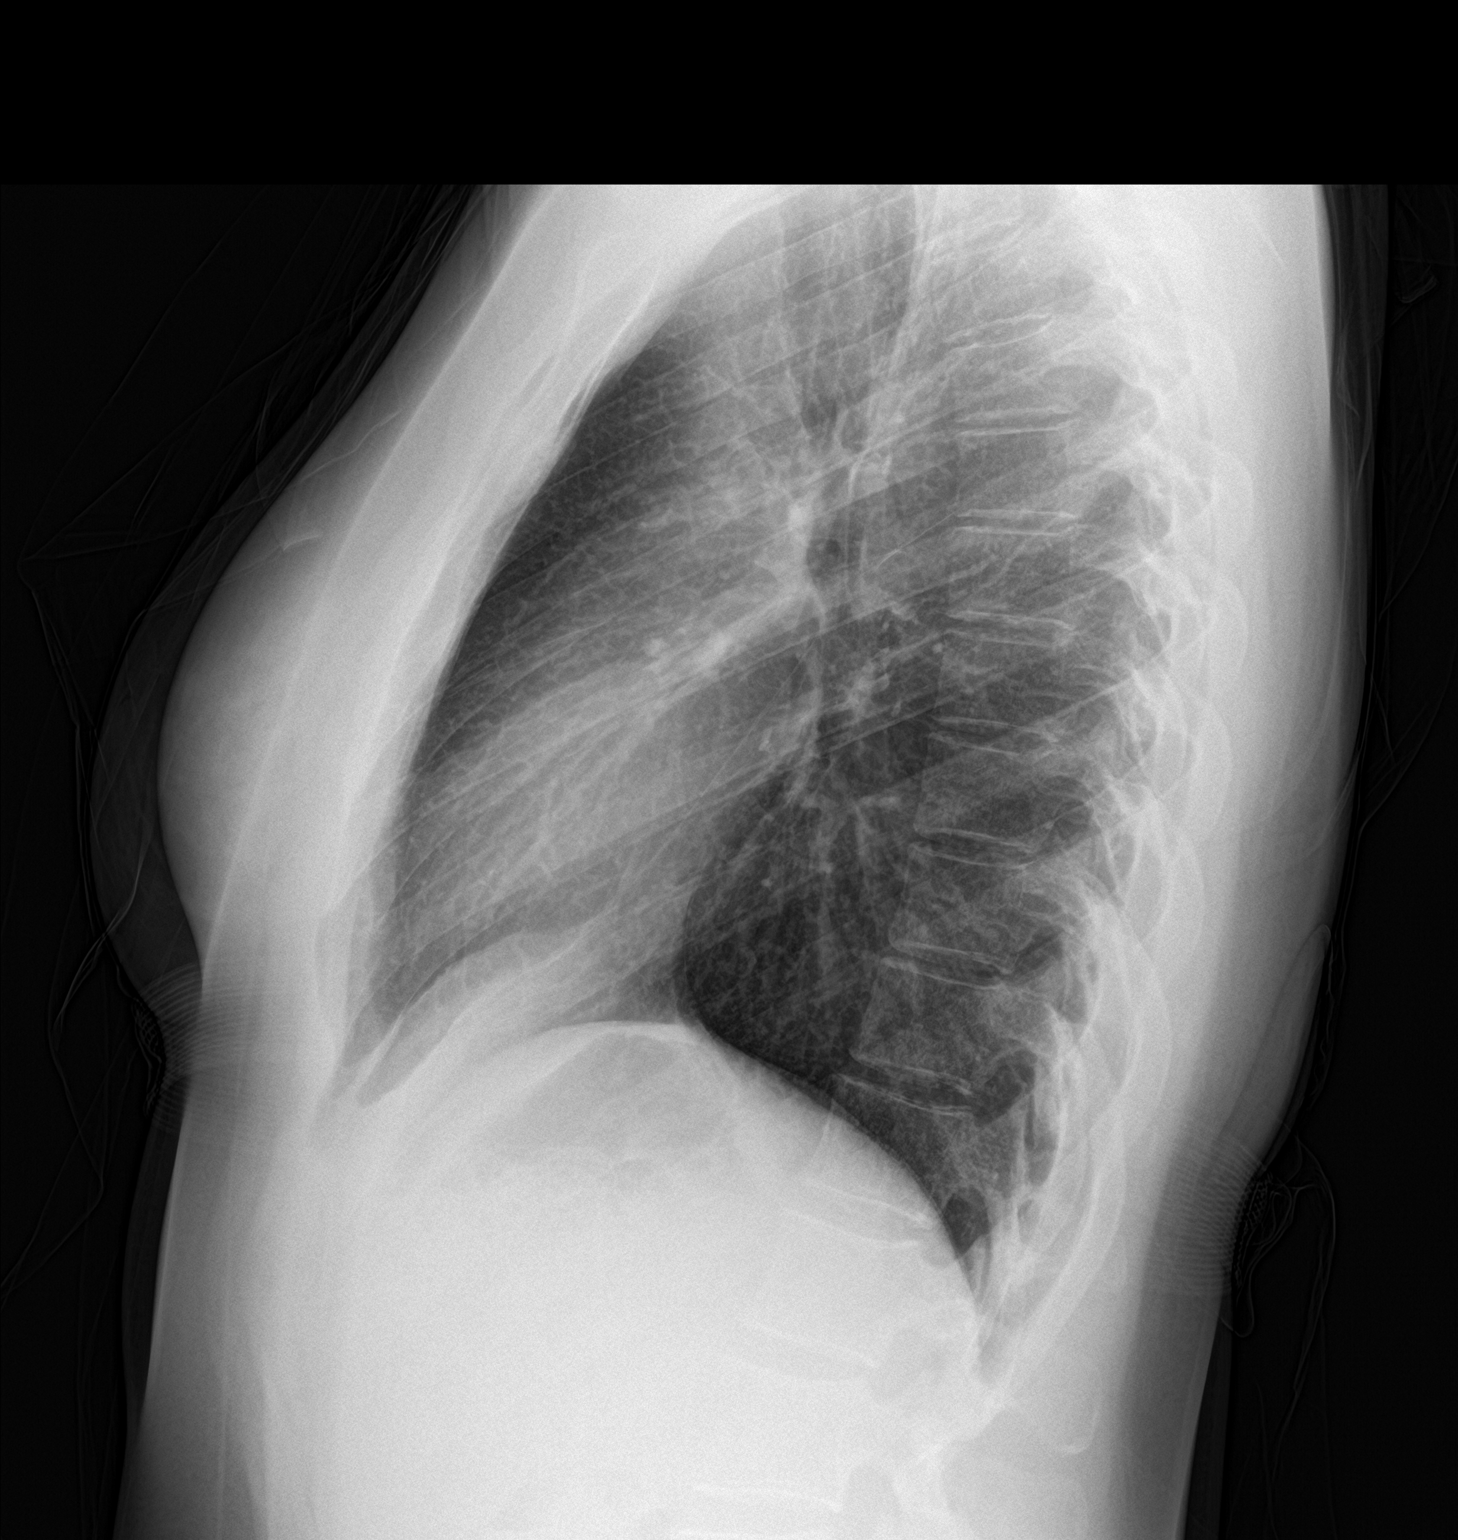

[2 of 2 positions shown; findings below may reference images not displayed]

FINDINGS: The heart size and mediastinal contours are within normal limits.
Both lungs are clear. The visualized skeletal structures are
unremarkable.
IMPRESSION: Unremarkable chest radiographs.

## 2020-05-02 ENCOUNTER — Ambulatory Visit (INDEPENDENT_AMBULATORY_CARE_PROVIDER_SITE_OTHER): Payer: BC Managed Care – PPO

## 2020-05-02 ENCOUNTER — Ambulatory Visit: Payer: Self-pay

## 2020-05-02 ENCOUNTER — Other Ambulatory Visit: Payer: Self-pay

## 2020-05-02 ENCOUNTER — Ambulatory Visit (INDEPENDENT_AMBULATORY_CARE_PROVIDER_SITE_OTHER): Payer: BC Managed Care – PPO | Admitting: Family Medicine

## 2020-05-02 VITALS — BP 122/82 | HR 65 | Ht 70.0 in | Wt 160.0 lb

## 2020-05-02 DIAGNOSIS — M79672 Pain in left foot: Secondary | ICD-10-CM

## 2020-05-02 DIAGNOSIS — M357 Hypermobility syndrome: Secondary | ICD-10-CM

## 2020-05-02 DIAGNOSIS — G5782 Other specified mononeuropathies of left lower limb: Secondary | ICD-10-CM

## 2020-05-02 DIAGNOSIS — M999 Biomechanical lesion, unspecified: Secondary | ICD-10-CM | POA: Diagnosis not present

## 2020-05-02 MED ORDER — GABAPENTIN 100 MG PO CAPS: 200.0000 mg | ORAL_CAPSULE | Freq: Every day | ORAL | 3 refills | Status: AC

## 2020-05-02 NOTE — Progress Notes (Signed)
[facility] [address] [place] [address] Phone: [phone] Subjective:   I Michele Burke am serving as a Neurosurgeon for Dr. Antoine Primas.  This visit occurred during the SARS-CoV-2 public health emergency.  Safety protocols were in place, including screening questions prior to the visit, additional usage of staff PPE, and extensive cleaning of exam room while observing appropriate contact time as indicated for disinfecting solutions.   I'm seeing this patient by the request  of:  Patient, No Pcp Per  CC: Foot pain, back pain  [id number]   04/19/2019 Patient has an injury noted.  Likely this is going to take some time.  Patient has other underlying problems that likely will contribute to some of the discomfort and pain.  Discussed with patient about CAM Walker daily for the next 4 weeks and then further evaluation with ultrasound again.  Continue vitamin D supplementation discussed other over-the-counter medications.  Update 05/02/2020 Michele Burke is a 29 y.o. female coming in with complaint of left foot and lower back pain. History of Lisfranc, left. Patient states the same area is painful. Believes it is overuse. Works as a Agricultural engineer. States that she is on her feet all the time. Foot cracks at times and is tired and sore. Back pain is also work related. States she has to help patients a lot and it is starting to bother her back. Pain deep in the hips. Left sided numbness in her feet but pain can be bilateral at times. Back is stiff and tight. Feels like she needs to constantly stretch but stretching does not help.  Patient feels like some of it is secondary to her compensating for the foot       Past Medical History:  Diagnosis Date  . Depression 2008   recurred in HS  . Dysmenorrhea 07/11/2010  . History of menorrhagia 07/11/2010  . Irritable bowel syndrome   . 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 Migraines middle school  .  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 COLONOSCOPY  2014   Dr. Jerral Ralph  . KNEE SURGERY Right    plica removal, arthroscopic  . TONSILLECTOMY  2000  . ULNA OSTEOTOMY Left 09/2014   Dr. Orlan Leavens; ulna shortening  . WISDOM TOOTH EXTRACTION  2011   Social History   Socioeconomic History  . Marital status: Single    Spouse name: Not on file  . Number of children: Not on file  . Years of education: Not on file  . Highest education level: Not on file  Occupational History  . Occupation: Consulting civil engineer  Tobacco Use  . Smoking status: Never Smoker  . Smokeless tobacco: Never Used  Vaping Use  . Vaping Use: Never used  Substance and Sexual Activity  . Alcohol use: No    Alcohol/week: 0.0 standard drinks    Comment: infrequent  . Drug use: No  . Sexual activity: Never    Birth control/protection: Other-see comments    Comment: CELIBATE  Other Topics Concern  . Not on file  Social History Narrative   Graduated from [employer].  Applying to grad school for school counseling (Masters in Education).  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   Social Determinants of Health   Financial Resource Strain: Not on file  Food Insecurity: Not on file  Transportation Needs: Not on file  Physical Activity: Not on file  Stress: Not on file  Social Connections: Not on file   Allergies  Allergen Reactions  . Other     Seasonal allergies Seasonal allergies Plastic Tape   Family History  Problem Relation Age of Onset  . Hypertension Father   . Hypothyroidism Mother   . Hyperlipidemia Mother        borderline; good HDL, borderline LDL  . Allergies Mother   . Lupus Maternal Grandmother   . Heart disease Maternal Grandfather        CHF  . COPD Maternal Grandfather   . Asthma Maternal Grandfather   .  Cancer Maternal Grandfather        thymic  . Stroke Paternal Grandfather 8  . Diabetes Paternal Grandfather     Current Outpatient Medications (Endocrine & Metabolic):  .  medroxyPROGESTERone (DEPO-PROVERA) 150 MG/ML injection, Inject 1 mL (150 mg total) into the muscle every 3 (three) months. .  predniSONE (DELTASONE) 50 MG tablet, Take one tablet daily for the next 5 days.  Current Outpatient Medications (Cardiovascular):  .  amLODipine (NORVASC) 2.5 MG tablet, Take 1 tablet (2.5 mg total) by mouth daily.  Current Outpatient Medications (Respiratory):  Marland Kitchen  Fexofenadine HCl (ALLEGRA PO), Take 1 tablet by mouth daily. .  montelukast (SINGULAIR) 10 MG tablet, TAKE 1 TABLET BY MOUTH EVERYDAY AT BEDTIME    Current Outpatient Medications (Other):  .  calcium carbonate (OS-CAL) 600 MG TABS, Take 600 mg by mouth 2 (two) times daily with a meal. .  cholecalciferol (VITAMIN D) 1000 UNITS tablet, Take 1,000 Units by mouth daily. Marland Kitchen  doxycycline (VIBRA-TABS) 100 MG tablet, Take 1 tablet (100 mg total) by mouth 2 (two) times daily. Marland Kitchen  gabapentin (NEURONTIN) 100 MG capsule, TAKE 2 CAPSULES (200 MG TOTAL) BY MOUTH AT BEDTIME. .  hydroxychloroquine (PLAQUENIL) 200 MG tablet, Take 400 mg by mouth daily.  Marland Kitchen  immune globulin, human, (GAMMAGARD S/D) 5 G injection, Inject into the vein. .  magnesium oxide (MAG-OX) 400 MG tablet, Take 1 tablet (400 mg total) by mouth daily. .  Multiple Vitamins-Minerals (HAIR/SKIN/NAILS PO), Take 1 tablet by mouth daily. Marland Kitchen  scopolamine (TRANSDERM-SCOP) 1 MG/3DAYS, Place 1 patch (1.5 mg total) onto the skin every 3 (three) days. Marland Kitchen  sertraline (ZOLOFT) 100 MG tablet, Take 150 mg by mouth daily. .  Vitamin D, Ergocalciferol, (DRISDOL) 1.25 MG (50000 UT) CAPS capsule, Take 1 capsule (50,000 Units total) by mouth every 7 (seven) days. Marland Kitchen  gabapentin (NEURONTIN) 100 MG capsule, Take 2 capsules (200 mg total) by mouth at bedtime.   Reviewed prior external information including  notes and imaging from  primary care provider As well as notes that were available from care everywhere and other healthcare systems.  Past medical history, social, surgical and family history all reviewed in electronic medical record.  No pertanent information unless stated regarding to the chief complaint.   Review of Systems:  No headache, visual changes, nausea, vomiting, diarrhea, constipation, dizziness, abdominal pain, skin rash, fevers, chills, night sweats, weight loss, swollen lymph nodes, body aches, joint swelling, chest pain, shortness of breath, mood changes. POSITIVE muscle aches  Objective  Blood pressure 122/82, pulse 65, height 5\' 10"  (1.778 m), weight 160 lb (72.6 kg), last menstrual period 04/02/2020, SpO2 92 %.   General: No apparent distress alert and oriented x3 mood and affect normal, dressed appropriately.  HEENT: Pupils equal, extraocular movements  intact  Respiratory: Patient's speak in full sentences and does not appear short of breath  Cardiovascular: No lower extremity edema, non tender, no erythema  Neuro: Cranial nerves II through XII are intact, neurovascularly intact in all extremities with 2+ DTRs and 2+ pulses.  Gait normal with good balance and coordination.  MSK:  Non tender with full range of motion and good stability and symmetric strength and tone of shoulders, elbows, wrist, hip, knee and ankles bilaterally.  Hypermobility noted Foot exam shows the patient actually has breakdown of the transverse arch.  Nontender over the dorsal aspect of the foot until you get more distal between the third and fourth toes.  Positive squeeze test noted.  Lisfranc area seems to be fairly unremarkable.  Ankle has good range of motion.  Low back exam very mild tightness in the paraspinal musculature lumbar spine left greater than right.  Mild positive Pearlean Brownie on the left.  Negative straight leg test.  Limited musculoskeletal ultrasound was performed and interpreted Judi Saa  Exam limited ultrasound shows that patient does have what appears to be more of a neuroma between the third and fourth metatarsal heads of distally as well as the fourth and fifth.  No true cortical irregularity noted of the Lisfranc joint or the midfoot.  Osteopathic findings  C4 flexed rotated and side bent left T3 extended rotated and side bent right inhaled third rib T8 extended rotated and side bent left L2 flexed rotated and side bent right Sacrum right on right    Impression and Recommendations:     The above documentation has been reviewed and is accurate and complete Judi Saa, DO

## 2020-05-02 NOTE — Patient Instructions (Addendum)
Good to see you Xrays today Think it is a neuroma  Transverse arch exercises Gabapentin 200 mg at night Ok to continue meloxicam for 10 days See me again in 4-5 weeks

## 2020-05-02 NOTE — Assessment & Plan Note (Signed)
Hypermobility noted.  Has had back pain.  Likely contributing is the foot causing compensation.  Discussed posture and ergonomics.  Increase activity slowly.  Attempted manipulation.  Follow-up again 4 weeks

## 2020-05-02 NOTE — Assessment & Plan Note (Signed)
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  X-rays are pending but I do not see any cortical irregularity that likely will be contributing at this time.  Patient is on the vitamin D already we will start her on a low dose of gabapentin due to the breakdown the transverse arch will get patient in custom orthotics that I think will be beneficial.  Patient does have hypermobility.  Discussed posture and ergonomics.  Patient given some limitations at work follow-up again in 4 to 8 weeks

## 2020-05-03 ENCOUNTER — Encounter: Payer: Self-pay | Admitting: Family Medicine

## 2020-05-03 NOTE — Assessment & Plan Note (Signed)

## 2020-05-04 ENCOUNTER — Encounter: Payer: Self-pay | Admitting: Family Medicine

## 2020-05-20 ENCOUNTER — Encounter: Payer: Self-pay | Admitting: Family Medicine

## 2020-05-27 NOTE — Progress Notes (Unsigned)
[name] [address] [place] [address] Phone: [phone] Subjective:   I Michele Burke am serving as a Neurosurgeon for Dr. Antoine Primas.  This visit occurred during the SARS-CoV-2 public health emergency.  Safety protocols were in place, including screening questions prior to the visit, additional usage of staff PPE, and extensive cleaning of exam room while observing appropriate contact time as indicated for disinfecting solutions.   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  X-rays are pending but I do not see any cortical irregularity that likely will be contributing at this time.  Patient is on the vitamin D already we will start her on a low dose of gabapentin due to the breakdown the transverse arch will get patient in custom orthotics that I think will be beneficial.  Patient does have hypermobility.  Discussed posture and ergonomics.  Patient given some limitations at work follow-up again in 4 to 8 weeks  Update 05/28/2020 NIKIESHA MILFORD is a 30 y.o. female coming in with complaint of left foot and right knee pain. Left foot is doing a lot better. Hurts after she works a shift. Haven't walked due to knee pain. After her last visit she went to [place] and did a lot of walking. Used orthotics. After her trip the pain got worse. A few days ago she walked to get ice cream and could barely walk back. Has tried biking and elliptical. Flexion is painful. Medial pain with quad tendon pain. States she always has tight hamstrings. Using a knee sleeve. Has tried ice, rest and oral medications. 6-7/10 at its worse.  Past medical history significant for benign hypermobility syndrome as well as juvenile dermatomyositis.  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 Depression 2008   recurred in HS  . Dysmenorrhea 07/11/2010  . History of menorrhagia 07/11/2010  . Irritable bowel syndrome   . 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 Migraines middle school  . 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 COLONOSCOPY  2014   Dr. Jerral Ralph  . KNEE SURGERY Right    plica removal, arthroscopic  . TONSILLECTOMY  2000  . ULNA OSTEOTOMY Left 09/2014   Dr. Orlan Leavens; ulna shortening  . WISDOM TOOTH EXTRACTION  2011   Social History   Socioeconomic History  . Marital status: Single    Spouse name: Not on file  . Number of children: Not on file  . Years of education: Not on file  . Highest education level: Not on file  Occupational History  . Occupation: Consulting civil engineer  Tobacco Use  . Smoking status: Never Smoker  . Smokeless tobacco: Never Used  Vaping Use  . Vaping Use: Never used  Substance and Sexual Activity  . Alcohol use: No    Alcohol/week: 0.0 standard drinks    Comment: infrequent  . Drug use: No  . Sexual activity: Never    Birth control/protection: Other-see comments    Comment: CELIBATE  Other Topics Concern  . Not on file  Social History Narrative   Graduated from [employer].  Applying to grad school for school  counseling (Masters in Programme researcher, broadcasting/film/video).  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   Social Determinants of Health   Financial Resource Strain: Not on file  Food Insecurity: Not on file  Transportation Needs: Not on file  Physical Activity: Not on file  Stress: Not on file  Social Connections: Not on file   Allergies  Allergen Reactions  . Other     Seasonal allergies Seasonal allergies Plastic Tape   Family History  Problem Relation Age of Onset  . Hypertension Father   .  Hypothyroidism Mother   . Hyperlipidemia Mother        borderline; good HDL, borderline LDL  . Allergies Mother   . Lupus Maternal Grandmother   . Heart disease Maternal Grandfather        CHF  . COPD Maternal Grandfather   . Asthma Maternal Grandfather   . Cancer Maternal Grandfather        thymic  . Stroke Paternal Grandfather 58  . Diabetes Paternal Grandfather     Current Outpatient Medications (Endocrine & Metabolic):  .  medroxyPROGESTERone (DEPO-PROVERA) 150 MG/ML injection, Inject 1 mL (150 mg total) into the muscle every 3 (three) months. .  predniSONE (DELTASONE) 50 MG tablet, Take one tablet daily for the next 5 days.  Current Outpatient Medications (Cardiovascular):  .  amLODipine (NORVASC) 2.5 MG tablet, Take 1 tablet (2.5 mg total) by mouth daily.  Current Outpatient Medications (Respiratory):  Marland Kitchen  Fexofenadine HCl (ALLEGRA PO), Take 1 tablet by mouth daily. .  montelukast (SINGULAIR) 10 MG tablet, TAKE 1 TABLET BY MOUTH EVERYDAY AT BEDTIME    Current Outpatient Medications (Other):  .  calcium carbonate (OS-CAL) 600 MG TABS, Take 600 mg by mouth 2 (two) times daily with a meal. .  cholecalciferol (VITAMIN D) 1000 UNITS tablet, Take 1,000 Units by mouth daily. Marland Kitchen  doxycycline (VIBRA-TABS) 100 MG tablet, Take 1 tablet (100 mg total) by mouth 2 (two) times daily. Marland Kitchen  gabapentin (NEURONTIN) 100 MG capsule, TAKE 2 CAPSULES (200 MG TOTAL) BY MOUTH AT BEDTIME. Marland Kitchen  gabapentin (NEURONTIN) 100 MG capsule, Take 2 capsules (200 mg total) by mouth at bedtime. .  hydroxychloroquine (PLAQUENIL) 200 MG tablet, Take 400 mg by mouth daily.  Marland Kitchen  immune globulin, human, (GAMMAGARD S/D) 5 G injection, Inject into the vein. .  magnesium oxide (MAG-OX) 400 MG tablet, Take 1 tablet (400 mg total) by mouth daily. .  Multiple Vitamins-Minerals (HAIR/SKIN/NAILS PO), Take 1 tablet by mouth daily. Marland Kitchen  scopolamine (TRANSDERM-SCOP) 1 MG/3DAYS, Place 1 patch (1.5 mg total) onto the skin every 3  (three) days. Marland Kitchen  sertraline (ZOLOFT) 100 MG tablet, Take 150 mg by mouth daily. .  Vitamin D, Ergocalciferol, (DRISDOL) 1.25 MG (50000 UT) CAPS capsule, Take 1 capsule (50,000 Units total) by mouth every 7 (seven) days.   Reviewed prior external information including notes and imaging from  primary care provider As well as notes that were available from care everywhere and other healthcare systems.  Past medical history, social, surgical and family history all reviewed in electronic medical record.  No pertanent information unless stated regarding to the chief complaint.   Review of Systems:  No headache, visual changes, nausea, vomiting, diarrhea, constipation, dizziness, abdominal pain, skin rash, fevers, chills, night sweats, weight loss, swollen lymph nodes, body aches, joint swelling, chest pain, shortness of breath, mood changes. POSITIVE muscle aches  Objective  There were no vitals taken for this visit.   General: No apparent distress alert and oriented x3 mood and affect normal, dressed appropriately.  HEENT: Pupils equal, extraocular movements intact  Respiratory: Patient's speak in full sentences and does not appear short of breath  Cardiovascular: No lower extremity edema, non tender, no erythema  Neuro: Cranial nerves II through XII are intact, neurovascularly intact in all extremities with 2+ DTRs and 2+ pulses.  Gait normal with good balance and coordination.  Right knee exam shows some tenderness noted more over the medial superior aspect of the patella and at the VMO.  Patient does have a thickening and tenderness that could be potentially a plica.  Mild crepitus noted with range of motion.  Kneecap very mild lateral tracking noted.  Minimal tenderness over the medial joint line.  Negative McMurray's.  Full range of motion no noted.  Limited musculoskeletal ultrasound was performed and interpreted by Lyndal Pulley  Limited ultrasound of patient's right knee shows that  patient does have thickening that could be consistent with a plica near the superior medial aspect of the patella.  Patient also near the insertion of the VMO to the quadricep tendon and seems to have increasing neovascularization in Doppler flow with hypoechoic changes within the muscle belly.  This could be consistent with muscle injury or potential myositis.  No true defect appreciated.  Patient's knee otherwise do not show any true effusion of the knee.  No bulging of the patella noted.  Medial meniscus appears to be unremarkable. Impression: Questionable abnormality of the VMO or possible thickening tissue of the superior medial patella consistent with plica syndrome    Impression and Recommendations:     The above documentation has been reviewed and is accurate and complete Lyndal Pulley, DO

## 2020-05-28 ENCOUNTER — Ambulatory Visit: Payer: Self-pay

## 2020-05-28 ENCOUNTER — Other Ambulatory Visit: Payer: Self-pay

## 2020-05-28 ENCOUNTER — Encounter: Payer: Self-pay | Admitting: Family Medicine

## 2020-05-28 ENCOUNTER — Ambulatory Visit (INDEPENDENT_AMBULATORY_CARE_PROVIDER_SITE_OTHER): Payer: BC Managed Care – PPO | Admitting: Family Medicine

## 2020-05-28 ENCOUNTER — Ambulatory Visit (INDEPENDENT_AMBULATORY_CARE_PROVIDER_SITE_OTHER): Payer: BC Managed Care – PPO

## 2020-05-28 VITALS — BP 126/80 | HR 71 | Ht 70.0 in | Wt 162.0 lb

## 2020-05-28 DIAGNOSIS — G8929 Other chronic pain: Secondary | ICD-10-CM

## 2020-05-28 DIAGNOSIS — M25561 Pain in right knee: Secondary | ICD-10-CM

## 2020-05-28 DIAGNOSIS — G5782 Other specified mononeuropathies of left lower limb: Secondary | ICD-10-CM

## 2020-05-28 NOTE — Patient Instructions (Signed)
Good to see you Quad exercises Knee compression sleeve Ice and voltaren gel Xray on the way out See me again in 4-5 weeks

## 2020-05-28 NOTE — Assessment & Plan Note (Addendum)
Right knee pain.  Discussed with patient in great length about icing regimen and home exercises.  Patient does have pain that seems to be more secondary to a potential myositis versus a distal VMO injury or even plica syndrome difficult to assess on ultrasound..  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  X-rays ordered today to rule out other bony abnormalities that could be contributing.  Home exercises given with athletic trainer.  Follow-up after 4 to 5 weeks.  Worsening pain consider advanced imaging or physical therapy

## 2020-05-28 NOTE — Assessment & Plan Note (Signed)
Improvement noted.  Did not focus on this with patient in need for patient having more acute pain of the knee.  Patient will continue with the same therapy at this time including the low-dose gabapentin.

## 2020-05-30 ENCOUNTER — Ambulatory Visit: Payer: BC Managed Care – PPO | Admitting: Family Medicine

## 2020-06-28 NOTE — Progress Notes (Deleted)
[name] [address] [place] [address] Phone: [phone] Subjective:    I'm seeing this patient by the request  of:  Patient, No Pcp Per  CC:   [id number]   05/28/2020 Right knee pain.  Discussed with patient in great length about icing regimen and home exercises.  Patient does have pain that seems to be more secondary to a potential myositis versus a distal VMO injury or even plica syndrome difficult to assess on ultrasound..  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  X-rays ordered today to rule out other bony abnormalities that could be contributing.  Home exercises given with athletic trainer.  Follow-up after 4 to 5 weeks.  Worsening pain consider advanced imaging or physical therapy  Improvement noted.  Did not focus on this with patient in need for patient having more acute pain of the knee.  Patient will continue with the same therapy at this time including the low-dose gabapentin.  Update 06/28/2020 LYNNIX SCHONEMAN is a 30 y.o. female coming in with complaint of right knee pain and left foot pain. Patient states        Past Medical History:  Diagnosis Date  . Depression 2008   recurred in HS  . Dysmenorrhea 07/11/2010  . History of menorrhagia 07/11/2010  . Irritable bowel syndrome   . 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 Migraines middle school  . 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 COLONOSCOPY  2014   Dr. Jerral Ralph  . KNEE SURGERY Right    plica removal, arthroscopic  . TONSILLECTOMY  2000  . ULNA OSTEOTOMY Left 09/2014   Dr. Orlan Leavens; ulna shortening  . WISDOM TOOTH EXTRACTION  2011   Social History   Socioeconomic History  . Marital status: Single    Spouse  name: Not on file  . Number of children: Not on file  . Years of education: Not on file  . Highest education level: Not on file  Occupational History  . Occupation: Consulting civil engineer  Tobacco Use  . Smoking status: Never Smoker  . Smokeless tobacco: Never Used  Vaping Use  . Vaping Use: Never used  Substance and Sexual Activity  . Alcohol use: No    Alcohol/week: 0.0 standard drinks    Comment: infrequent  . Drug use: No  . Sexual activity: Never    Birth control/protection: Other-see comments    Comment: CELIBATE  Other Topics Concern  . Not on file  Social History Narrative   Graduated from [employer].  Applying to grad school for school counseling (Masters in Education).  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   Social Determinants of Health   Financial Resource Strain: Not on file  Food Insecurity: Not on file  Transportation Needs: Not on file  Physical Activity: Not on file  Stress: Not on file  Social Connections: Not on file   Allergies  Allergen Reactions  . Other     Seasonal allergies Seasonal allergies Plastic Tape   Family History  Problem Relation Age of Onset  . Hypertension Father   . Hypothyroidism Mother   . Hyperlipidemia  Mother        borderline; good HDL, borderline LDL  . Allergies Mother   . Lupus Maternal Grandmother   . Heart disease Maternal Grandfather        CHF  . COPD Maternal Grandfather   . Asthma Maternal Grandfather   . Cancer Maternal Grandfather        thymic  . Stroke Paternal Grandfather 3  . Diabetes Paternal Grandfather     Current Outpatient Medications (Endocrine & Metabolic):  .  medroxyPROGESTERone (DEPO-PROVERA) 150 MG/ML injection, Inject 1 mL (150 mg total) into the muscle every 3 (three) months. .  predniSONE (DELTASONE) 50 MG tablet, Take one tablet daily for the next 5 days.  Current Outpatient Medications  (Cardiovascular):  .  amLODipine (NORVASC) 2.5 MG tablet, Take 1 tablet (2.5 mg total) by mouth daily.  Current Outpatient Medications (Respiratory):  Marland Kitchen  Fexofenadine HCl (ALLEGRA PO), Take 1 tablet by mouth daily. .  montelukast (SINGULAIR) 10 MG tablet, TAKE 1 TABLET BY MOUTH EVERYDAY AT BEDTIME    Current Outpatient Medications (Other):  .  calcium carbonate (OS-CAL) 600 MG TABS, Take 600 mg by mouth 2 (two) times daily with a meal. .  cholecalciferol (VITAMIN D) 1000 UNITS tablet, Take 1,000 Units by mouth daily. Marland Kitchen  doxycycline (VIBRA-TABS) 100 MG tablet, Take 1 tablet (100 mg total) by mouth 2 (two) times daily. Marland Kitchen  gabapentin (NEURONTIN) 100 MG capsule, TAKE 2 CAPSULES (200 MG TOTAL) BY MOUTH AT BEDTIME. Marland Kitchen  gabapentin (NEURONTIN) 100 MG capsule, Take 2 capsules (200 mg total) by mouth at bedtime. .  hydroxychloroquine (PLAQUENIL) 200 MG tablet, Take 400 mg by mouth daily.  Marland Kitchen  immune globulin, human, (GAMMAGARD S/D) 5 G injection, Inject into the vein. .  magnesium oxide (MAG-OX) 400 MG tablet, Take 1 tablet (400 mg total) by mouth daily. .  Multiple Vitamins-Minerals (HAIR/SKIN/NAILS PO), Take 1 tablet by mouth daily. Marland Kitchen  scopolamine (TRANSDERM-SCOP) 1 MG/3DAYS, Place 1 patch (1.5 mg total) onto the skin every 3 (three) days. Marland Kitchen  sertraline (ZOLOFT) 100 MG tablet, Take 150 mg by mouth daily. .  Vitamin D, Ergocalciferol, (DRISDOL) 1.25 MG (50000 UT) CAPS capsule, Take 1 capsule (50,000 Units total) by mouth every 7 (seven) days.   Reviewed prior external information including notes and imaging from  primary care provider As well as notes that were available from care everywhere and other healthcare systems.  Past medical history, social, surgical and family history all reviewed in electronic medical record.  No pertanent information unless stated regarding to the chief complaint.   Review of Systems:  No headache, visual changes, nausea, vomiting, diarrhea, constipation, dizziness,  abdominal pain, skin rash, fevers, chills, night sweats, weight loss, swollen lymph nodes, body aches, joint swelling, chest pain, shortness of breath, mood changes. POSITIVE muscle aches  Objective  There were no vitals taken for this visit.   General: No apparent distress alert and oriented x3 mood and affect normal, dressed appropriately.  HEENT: Pupils equal, extraocular movements intact  Respiratory: Patient's speak in full sentences and does not appear short of breath  Cardiovascular: No lower extremity edema, non tender, no erythema  Gait normal with good balance and coordination.  MSK:  Non tender with full range of motion and good stability and symmetric strength and tone of shoulders, elbows, wrist, hip, knee and ankles bilaterally.     Impression and Recommendations:     The above documentation has been reviewed and is accurate and complete Wilford Grist

## 2020-07-01 ENCOUNTER — Ambulatory Visit: Payer: BC Managed Care – PPO | Admitting: Family Medicine

## 2020-08-27 ENCOUNTER — Other Ambulatory Visit: Payer: Self-pay

## 2020-08-27 ENCOUNTER — Encounter: Payer: Self-pay | Admitting: Family Medicine

## 2020-08-27 ENCOUNTER — Ambulatory Visit: Payer: Self-pay

## 2020-08-27 ENCOUNTER — Ambulatory Visit (INDEPENDENT_AMBULATORY_CARE_PROVIDER_SITE_OTHER): Payer: BC Managed Care – PPO | Admitting: Family Medicine

## 2020-08-27 ENCOUNTER — Ambulatory Visit (INDEPENDENT_AMBULATORY_CARE_PROVIDER_SITE_OTHER): Payer: BC Managed Care – PPO

## 2020-08-27 VITALS — BP 106/74 | HR 55 | Ht 70.0 in | Wt 164.8 lb

## 2020-08-27 DIAGNOSIS — M25532 Pain in left wrist: Secondary | ICD-10-CM | POA: Diagnosis not present

## 2020-08-27 NOTE — Patient Instructions (Signed)
Thank you for coming in today.  Use a wrist brace as needed.   Use voltaren gel as needed up to 4x daily.   Plan for hand PT.   Let me know if you are unable to work starting as scheduled next Wednesday.   Recheck as needed especially if not improving.

## 2020-08-27 NOTE — Progress Notes (Signed)
I, Michele Burke, LAT, ATC acting as a scribe for Michele Graham, MD.  Michele Burke is a 30 y.o. female who presents to [facility] today for wrist pain since. Pt was last seen by Dr. Katrinka Blazing on 05/28/20 for chronic R knee pain. Today, pt locates her pain to her L wrist and volar hand that began yesterday when she was trying to hold down a combative pt.  She states that she may have hyperextended it but isn't sure.  Swelling: Questionable Aggravates: pressure to the wrist or volar hand; wrist extension Treatments tried: IBU  Dx imaging: 04/26/02 R hand XR  Pertinent review of systems: No fevers or chills  Relevant historical information: History of left wrist fracture of ulna with ORIF plate.   Exam:  BP 106/74 (BP Location: Right Arm, Patient Position: Sitting, Cuff Size: Normal)   Pulse (!) 55   Ht 5\' 10"  (1.778 m)   Wt 164 lb 12.8 oz (74.8 kg)   LMP 08/13/2020   SpO2 97%   BMI 23.65 kg/m  General: Well Developed, well nourished, and in no acute distress.   MSK: Left wrist mature scar overlying distal ulna.  Mild wrist swelling present. Wrist motion lacks full extension full flexion by few degrees. Tender palpation dorsal wrist and mild tender at TFCC ulnar wrist.  Nontender volar wrist. Intact strength. Pulses cap refill and sensation are intact. Grip strength is intact.    Lab and Radiology Results  X-ray images left wrist obtained today personally and independently interpreted Intact surgical hardware overlying left ulna.  No fractures visible.  No severe DJD. Await formal radiology review  Diagnostic Limited MSK Ultrasound of: Left wrist Dorsal wrist reveals no significant joint effusion.  Intact normal-appearing dorsal extensor tendons however small amount of hypoechoic fluid collection within 6 dorsal wrist compartment (ECU  Tendon). TFCC small amount of hypoechoic change indicating possible tear on ultrasound  examination. Impression: Possible extensor carpi ulnaris tendinitis.  Possible TFCC injury.      Assessment and Plan: 30 y.o. female with left wrist strain following a workplace injury where her wrist was forcibly extended.  This occurs in the setting of a prior fracture.  I think it is very likely that she is going to be okay.  Plan for a bit of rest with wrist splint and referral to hand therapy.  Additionally recommend Voltaren gel.  She is not scheduled to work again until Wednesday, April 13 so she has a natural period of time for rest which I think is an excellent idea.  If unable to return to work successfully on the 13th happy to provide more documentation of work notes if needed.  Patient may consider filing this under Worker's Comp. as she did have a workplace injury and does have a injury bad enough that she is now seeking medical attention.  If not improved with time and PT and Voltaren gel next step would be MRI arthrogram.   PDMP not reviewed this encounter. Orders Placed This Encounter  Procedures  . 14 LIMITED JOINT SPACE STRUCTURES UP LEFT(NO LINKED CHARGES)    Order Specific Question:   Reason for Exam (SYMPTOM  OR DIAGNOSIS REQUIRED)    Answer:   L wrist pain    Order Specific Question:   Preferred imaging location?    Answer:   [facility]  . DG Wrist Complete Left    Standing Status:   Future    Number of Occurrences:  1    Standing Expiration Date:   08/27/2021    Order Specific Question:   Reason for Exam (SYMPTOM  OR DIAGNOSIS REQUIRED)    Answer:   eval left wrist pain. Wrist extended    Order Specific Question:   Is patient pregnant?    Answer:   No    Order Specific Question:   Preferred imaging location?    Answer:   [facility]  . Ambulatory referral to Physical Therapy    Referral Priority:   Routine    Referral Type:   Physical Medicine    Referral Reason:   Specialty Services Required    Requested Specialty:    Physical Therapy    Number of Visits Requested:   1   No orders of the defined types were placed in this encounter.    Discussed warning signs or symptoms. Please see discharge instructions. Patient expresses understanding.   The above documentation has been reviewed and is accurate and complete Michele Burke, M.D.

## 2020-08-29 NOTE — Progress Notes (Signed)
Left wrist x-ray shows intact surgical hardware.  No fractures are visible.

## 2020-09-09 ENCOUNTER — Encounter: Payer: Self-pay | Admitting: Family Medicine

## 2020-09-11 MED ORDER — DEXAMETHASONE SODIUM PHOSPHATE 4 MG/ML IJ SOLN: INTRAMUSCULAR | 2 refills | Status: AC

## 2020-09-11 NOTE — Addendum Note (Signed)
Addended by: Rodolph Bong on: 09/11/2020 06:57 AM   Modules accepted: Orders

## 2021-05-20 IMAGING — DX DG KNEE COMPLETE 4+V*R*
4 series · 4 of 4 positions shown · non-contrast
Comparison: None.

CLINICAL DATA: 29-year-old female with right knee pain

EXAM:
RIGHT KNEE - COMPLETE 4+ VIEW

[knee ap]
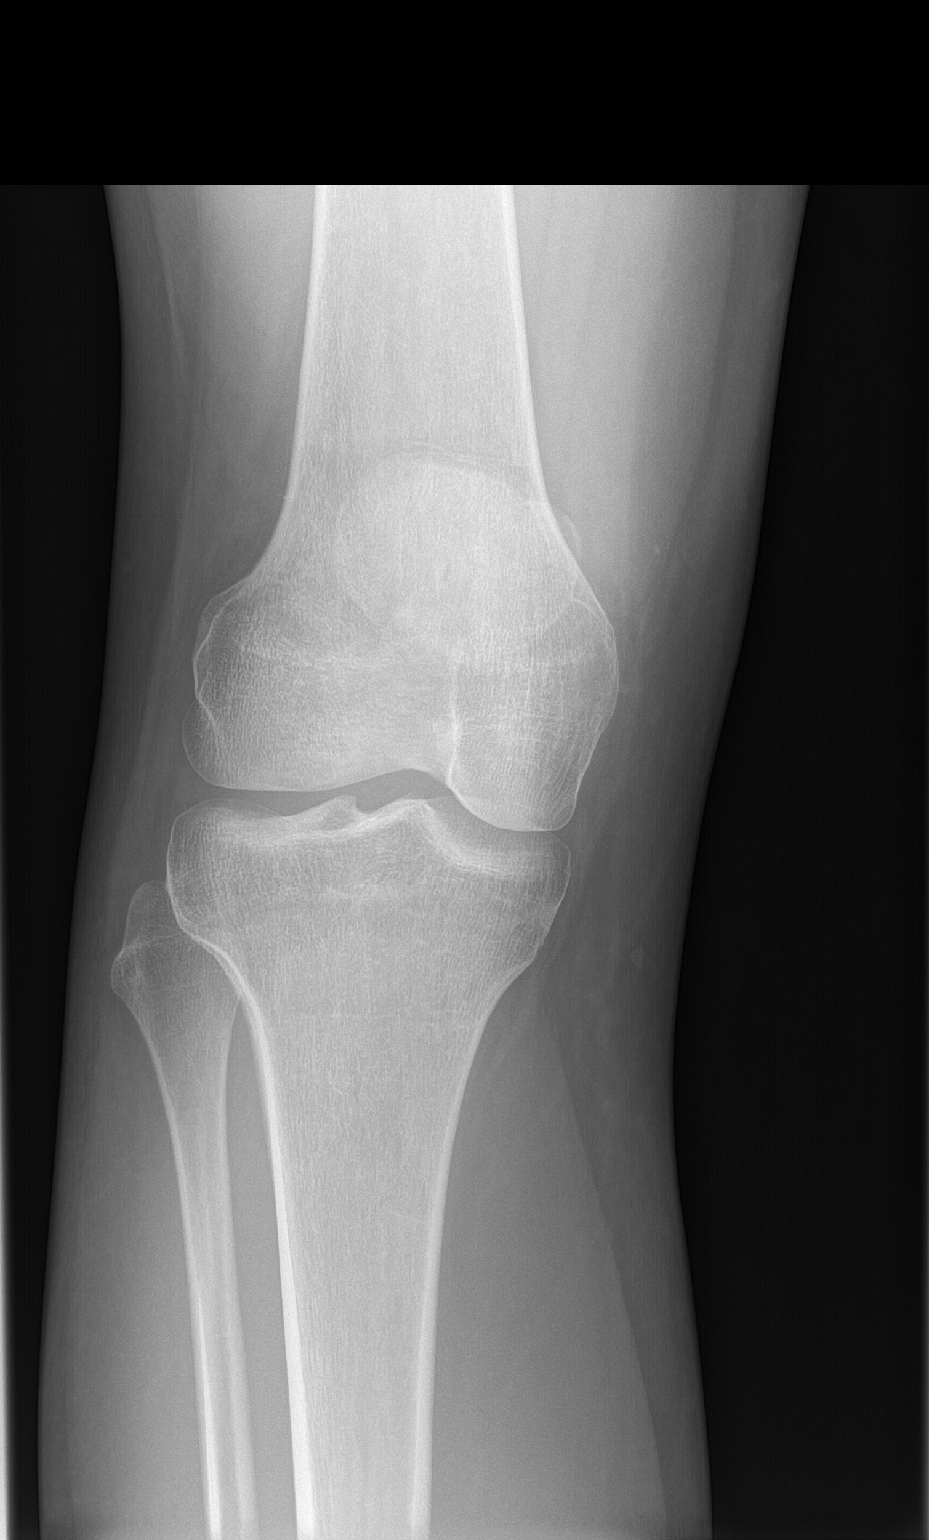

[knee obl (1 of 2)]
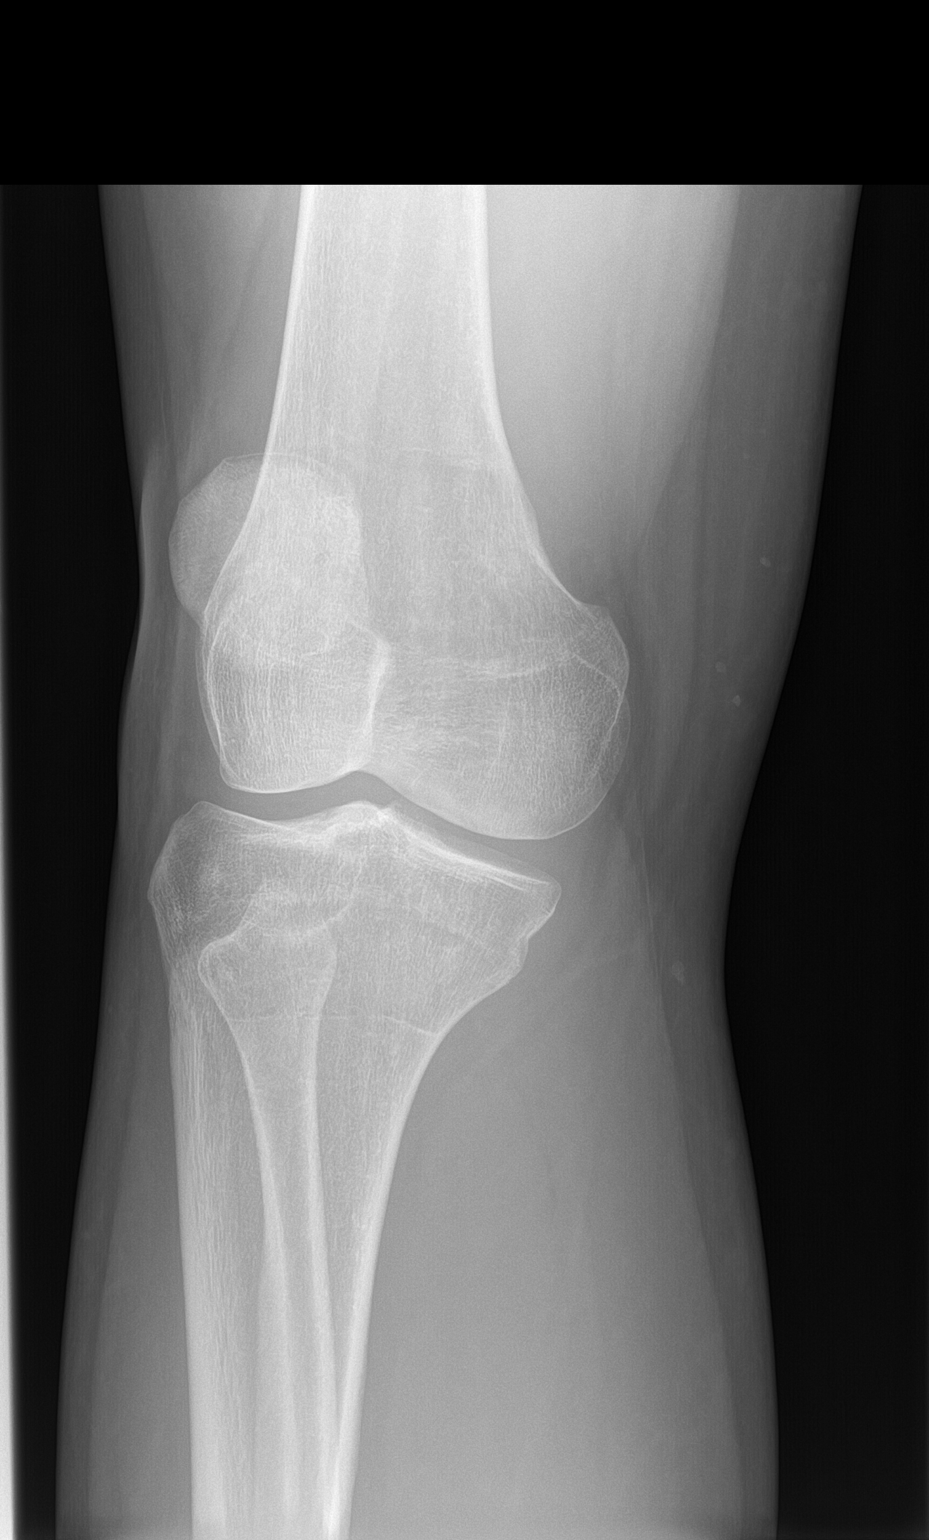

[knee obl (2 of 2)]
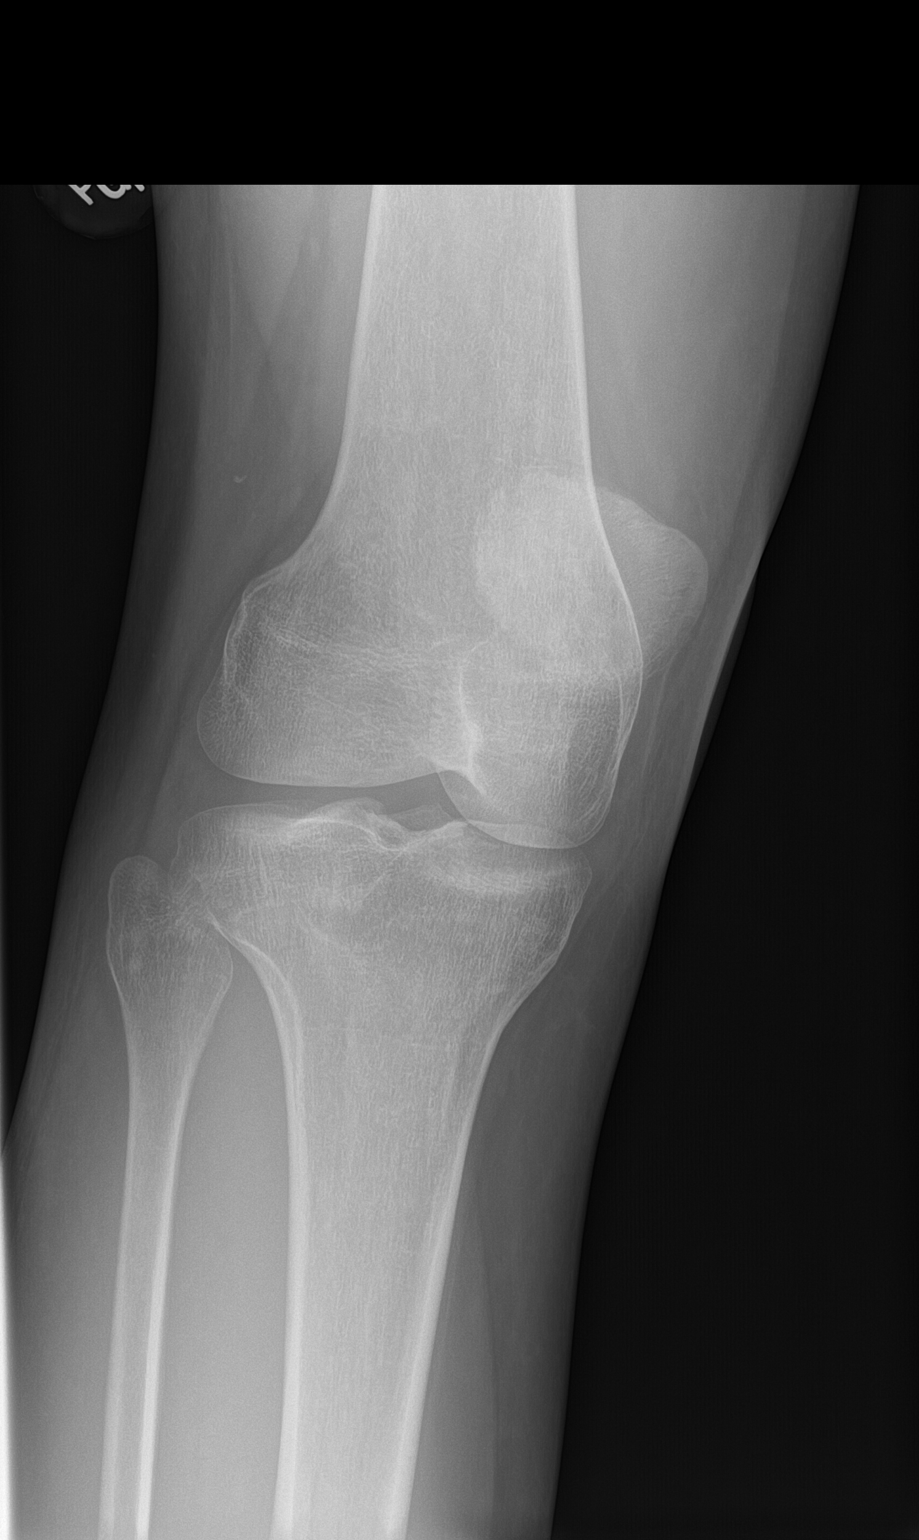

[knee lat]
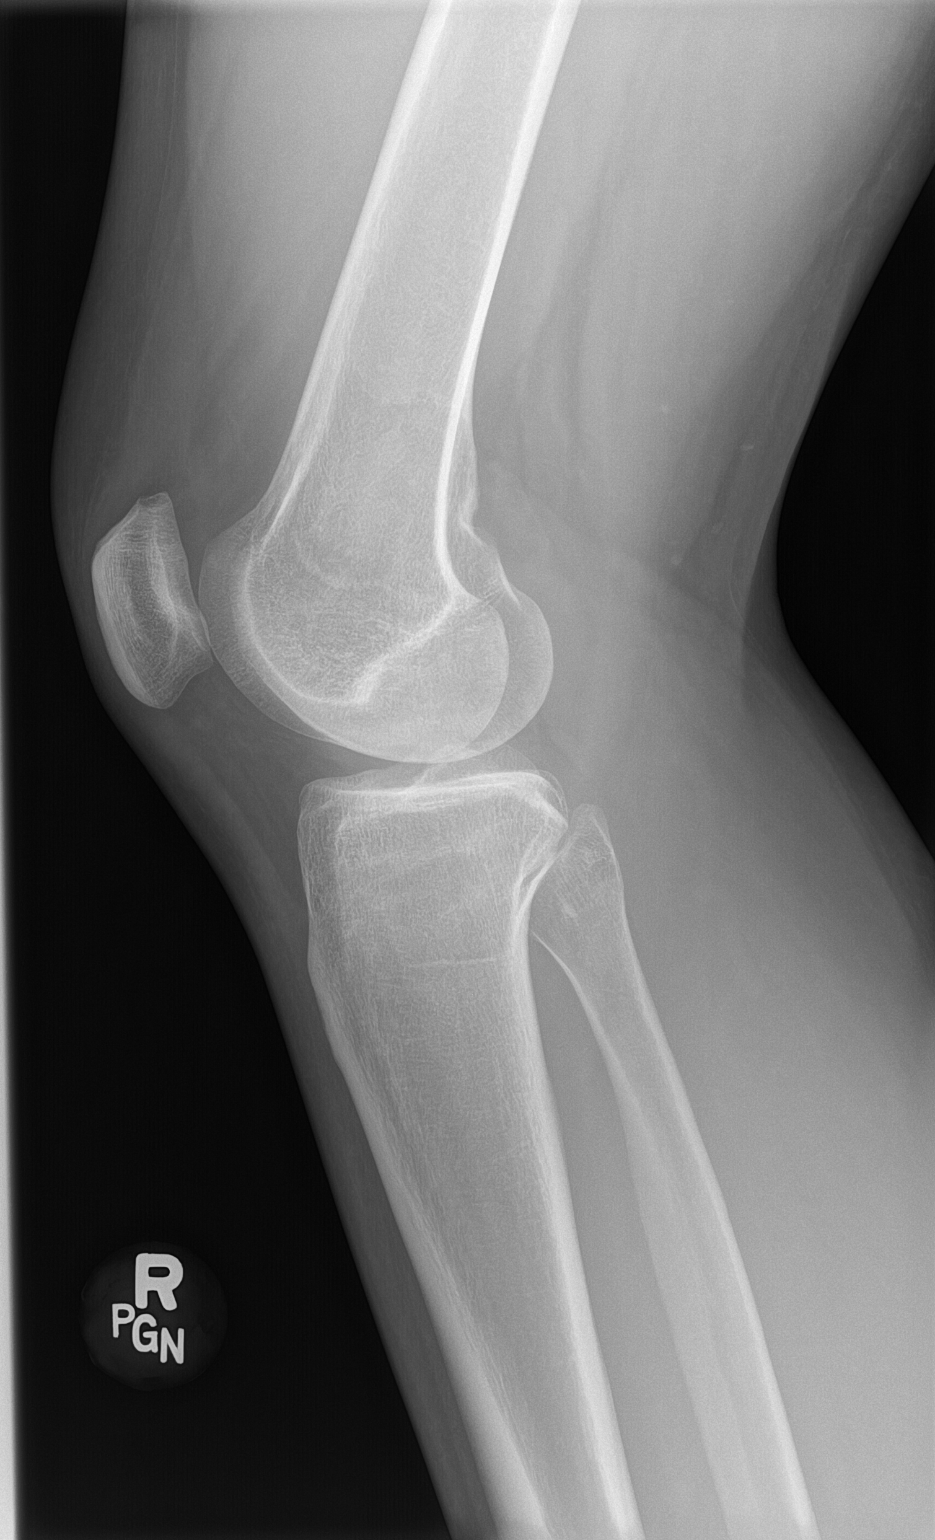

[4 of 4 positions shown; findings below may reference images not displayed]

FINDINGS: No evidence of fracture, dislocation, or joint effusion. No evidence
of arthropathy or other focal bone abnormality. Soft tissues are
unremarkable.
IMPRESSION: Negative.
